# Patient Record
Sex: Female | Born: 1980 | Hispanic: Yes | Marital: Single | Smoking: Never smoker
Health system: Southern US, Community
[De-identification: ages and names within clinical notes are randomized; demographics above are authoritative.]

## PROBLEM LIST (undated history)

## (undated) HISTORY — PX: BREAST BIOPSY: SHX20

---

## 2000-08-24 DIAGNOSIS — N6320 Unspecified lump in the left breast, unspecified quadrant: Secondary | ICD-10-CM | POA: Insufficient documentation

## 2005-01-05 ENCOUNTER — Emergency Department: Payer: Self-pay | Admitting: Emergency Medicine

## 2005-01-06 ENCOUNTER — Ambulatory Visit: Payer: Self-pay | Admitting: Emergency Medicine

## 2005-08-15 ENCOUNTER — Inpatient Hospital Stay: Payer: Self-pay | Admitting: General Surgery

## 2007-01-29 ENCOUNTER — Emergency Department: Payer: Self-pay | Admitting: Unknown Physician Specialty

## 2011-08-17 ENCOUNTER — Inpatient Hospital Stay: Payer: Self-pay | Admitting: Obstetrics and Gynecology

## 2011-08-17 LAB — CBC WITH DIFFERENTIAL/PLATELET
Basophil #: 0 10*3/uL (ref 0.0–0.1)
Eosinophil #: 0 10*3/uL (ref 0.0–0.7)
HCT: 37.1 % (ref 35.0–47.0)
HGB: 12.3 g/dL (ref 12.0–16.0)
Lymphocyte %: 23.2 %
MCH: 29.2 pg (ref 26.0–34.0)
MCHC: 33.2 g/dL (ref 32.0–36.0)
Monocyte #: 0.6 10*3/uL (ref 0.0–0.7)
Neutrophil #: 5.6 10*3/uL (ref 1.4–6.5)
Neutrophil %: 68.8 %
RDW: 17.3 % — ABNORMAL HIGH (ref 11.5–14.5)
WBC: 8.2 10*3/uL (ref 3.6–11.0)

## 2011-08-17 LAB — CK TOTAL AND CKMB (NOT AT ARMC)
CK, Total: 75 U/L (ref 21–215)
CK, Total: 63 U/L (ref 21–215)
CK-MB: 2.4 ng/mL (ref 0.5–3.6)

## 2011-08-17 LAB — TROPONIN I: Troponin-I: <0.02 ng/mL

## 2011-08-18 LAB — HEMATOCRIT: HCT: 29.1 % — ABNORMAL LOW (ref 35.0–47.0)

## 2013-07-24 ENCOUNTER — Encounter: Payer: Self-pay | Admitting: General Surgery

## 2013-07-24 ENCOUNTER — Ambulatory Visit: Payer: Self-pay

## 2013-08-08 ENCOUNTER — Ambulatory Visit: Payer: Self-pay | Admitting: General Surgery

## 2013-08-21 ENCOUNTER — Ambulatory Visit (INDEPENDENT_AMBULATORY_CARE_PROVIDER_SITE_OTHER): Payer: PRIVATE HEALTH INSURANCE | Admitting: General Surgery

## 2013-08-21 ENCOUNTER — Encounter: Payer: Self-pay | Admitting: General Surgery

## 2013-08-21 VITALS — BP 116/70 | HR 76 | Resp 12 | Ht 62.0 in | Wt 172.0 lb

## 2013-08-21 DIAGNOSIS — R229 Localized swelling, mass and lump, unspecified: Secondary | ICD-10-CM

## 2013-08-21 DIAGNOSIS — D229 Melanocytic nevi, unspecified: Secondary | ICD-10-CM

## 2013-08-21 DIAGNOSIS — N6320 Unspecified lump in the left breast, unspecified quadrant: Secondary | ICD-10-CM

## 2013-08-21 NOTE — Progress Notes (Signed)
[name], female   DOB: 09/29/1980, 33 y.o.   MRN: [id number]  Chief Complaint  Patient presents with  . Breast Problem    HPI Briana James is a 33 y.o. female who presents for an evaluation of a left breast nodule. No mammogram was done at this time. The patient noticed this area in 2002 when she was pregnant she had it looked at and was told not to worry about it that it was milk build up. She has since then had another child and the area is still there. She states she has left breast pain all over that comes randomly and goes away within a few seconds. She does not associate it with her monthly cycles. She described that the breast nodule is the size of the point of a pen or pencil. She denies it getting any larger. No injuries to the breasts. She nursed with her third child for a short period of time (approximately 3 weeks). She did not nurse her first 2 children. There is no history of breast trauma.   HPI  History reviewed. No pertinent past medical history.  Past Surgical History  Procedure Laterality Date  . Breast biopsy Right 2003-2004    History reviewed. No pertinent family history.  Social History History  Substance Use Topics  . Smoking status: Never Smoker   . Smokeless tobacco: Never Used  . Alcohol Use: No    No Known Allergies  No current outpatient prescriptions on file.   No current facility-administered medications for this visit.    Review of Systems Review of Systems  Blood pressure 116/70, pulse 76, resp. rate 12, height 5\' 2"  (1.575 m), weight 172 lb (78.019 kg), last menstrual period 08/20/2013.  Physical Exam Physical Exam  Constitutional: She is oriented to person, place, and time. She appears well-developed and well-nourished.  Neck: Neck supple. No thyromegaly present.  Cardiovascular: Normal rate, regular rhythm and normal heart sounds.   No murmur heard. Pulmonary/Chest: Effort normal and breath sounds normal. Right breast  exhibits no inverted nipple, no mass, no nipple discharge, no skin change and no tenderness. Left breast exhibits no inverted nipple, no mass, no nipple discharge, no skin change and no tenderness.    Well healed circular areolar 2:30-3 o'clock of right breast.   Skin tag on inframammary gland at 5 o'clock of left breast.   At 4 o'clock 2 mm thickening just inside the areola of left breast.   Lymphadenopathy:    She has no cervical adenopathy.    She has no axillary adenopathy.  Neurological: She is alert and oriented to person, place, and time.  Skin: Skin is warm and dry.       Data Reviewed [facility] notes  Assessment    Dermal nodule.  Past [place], unclear if it's related to the breast with the associated musculoskeletal tissue. Pigmented mole of the right posterior shoulder, unlikely but possible melanoma.    Plan    The breast nodule by patient report is unchanged over 12 years. I think this is a dermal nodule related to the accessory glands of [place] and no intervention is required.  I did encourage the patient to have a biopsy completed of the right posterior shoulder mole. She was amenable to this. 3 cc of 0.5% Xylocaine with 0.25% Marcaine with one 200,000 of epinephrine was utilized well-tolerated. Chlor prep was applied to the skin. A 2.5 mm punch biopsy was completed. This was sent in formalin for routine histology. The  area was covered with Telfa and Tegaderm dressing. She return in one week for suture removal.   The patient may benefit from a trial of anti-inflammatories of the breast, but observation alone is also appropriate.       Robert Bellow 08/24/2013, 2:43 PM

## 2013-08-21 NOTE — Patient Instructions (Signed)
Patient to return for suture removal.

## 2013-08-24 DIAGNOSIS — R229 Localized swelling, mass and lump, unspecified: Secondary | ICD-10-CM | POA: Insufficient documentation

## 2013-08-28 ENCOUNTER — Ambulatory Visit (INDEPENDENT_AMBULATORY_CARE_PROVIDER_SITE_OTHER): Payer: Self-pay | Admitting: *Deleted

## 2013-08-28 DIAGNOSIS — D239 Other benign neoplasm of skin, unspecified: Secondary | ICD-10-CM

## 2013-08-28 DIAGNOSIS — D229 Melanocytic nevi, unspecified: Secondary | ICD-10-CM

## 2013-08-28 LAB — PATHOLOGY

## 2013-08-28 NOTE — Patient Instructions (Signed)
Patient to return as needed. Patient will be notified of pathology results once we receive them. The patient is aware to call back for any questions or concerns.

## 2013-08-28 NOTE — Progress Notes (Signed)
Patient came in today for a wound check.  The wound is clean, with no signs of infection noted. The sutures were removed and steri strips applied. Follow up as scheduled. Patient will be notified of pathology results once we receive results.

## 2013-09-03 ENCOUNTER — Telehealth: Payer: Self-pay

## 2013-09-03 NOTE — Telephone Encounter (Signed)
Notified patient as instructed, patient pleased. Discussed having Dr Bary Castilla remove the rest of the area at no charge, patient agrees. Patient scheduled for appointment for removal of area on 09/18/13 at 4:00 pm.

## 2013-09-18 ENCOUNTER — Encounter: Payer: Self-pay | Admitting: General Surgery

## 2013-09-18 ENCOUNTER — Ambulatory Visit (INDEPENDENT_AMBULATORY_CARE_PROVIDER_SITE_OTHER): Payer: Self-pay | Admitting: General Surgery

## 2013-09-18 VITALS — BP 108/64 | HR 62 | Resp 12 | Ht 62.0 in | Wt 176.0 lb

## 2013-09-18 DIAGNOSIS — R229 Localized swelling, mass and lump, unspecified: Secondary | ICD-10-CM

## 2013-09-18 NOTE — Patient Instructions (Addendum)
Keep area clean Dressing care reviewed. return for suture removal in one week

## 2013-09-18 NOTE — Progress Notes (Signed)
Patient ID: Briana James, female   DOB: 11/29/1980, 33 y.o.   MRN: [id number]  Chief Complaint  Patient presents with  . Procedure    excision pigmented rash area    HPI Briana James is a 33 y.o. female.  here today for excision pigmented rash area posterior neck.   HPI  No past medical history on file.  Past Surgical History  Procedure Laterality Date  . Breast biopsy Right 2003-2004    No family history on file.  Social History History  Substance Use Topics  . Smoking status: Never Smoker   . Smokeless tobacco: Never Used  . Alcohol Use: No    No Known Allergies  No current outpatient prescriptions on file.   No current facility-administered medications for this visit.    Review of Systems Review of Systems  Constitutional: Negative.   Respiratory: Negative.   Cardiovascular: Negative.     Blood pressure 108/64, pulse 62, resp. rate 12, height 5\' 2"  (1.575 m), weight 176 lb (79.833 kg), last menstrual period 08/18/2013.  Physical Exam Physical Exam The biopsy site on the right posterior neck is healing without evidence of infection. The patient had a question about a small seborrheic keratosis on the right lateral thigh and a small flap 3 mm uniformly pigmented lesion on the right anterior thigh. Uterus suspicious for malignancy.  Data Reviewed Diagnosis: RIGHT NECK MOLE ON POSTERIOR PUNCH BIOPSY: - INTRADERMAL MELANOCYTIC NEVUS, SEE COMMENT. Comment: Correlation with the clinical appearance is needed since the nevus involves the peripheral edges of the biopsy. It is generally recommended that pigmented lesions be entirely removed, so if this is a small sample from a larger lesion, then excision may be necessary. Intradepartmental consultation was obtained. XDB/08/28/2013    Assessment    Melanocytic nevus right posterior neck.     Plan    Excision was reviewed with the patient. 10 cc of 0.5% Xylocaine with 0.25% Marcaine with 1-200,000 units of  epinephrine was utilized and well tolerated. ChloraPrep was applied to the skin. The residual nevus was excised through an elliptical incision. The wound was closed with interrupted 4-0 Prolene sutures x3. Telfa and Tegaderm dressing applied. The patient will return in one week for suture removal.      PCP: none [facility]  [facility] [place] 09/18/2013, 8:13 PM

## 2013-09-21 LAB — PATHOLOGY

## 2013-09-24 ENCOUNTER — Telehealth: Payer: Self-pay | Admitting: *Deleted

## 2013-09-24 NOTE — Telephone Encounter (Signed)
Notified patient as instructed, patient pleased. Discussed follow-up appointments, patient agrees  

## 2013-09-24 NOTE — Telephone Encounter (Signed)
Message copied by Carson Myrtle on Tue Sep 24, 2013  8:29 AM ------      Message from: [place], [place] W      Created: Mon Sep 23, 2013 10:23 AM       Notify pathology is as expected and clear. Suture removal as planned.      ----- Message -----         From: [facility] Lab Results In Interface         Sent: 09/21/2013   5:43 AM           To: Robert Bellow, MD                   ------

## 2013-09-25 ENCOUNTER — Ambulatory Visit (INDEPENDENT_AMBULATORY_CARE_PROVIDER_SITE_OTHER): Payer: Self-pay | Admitting: *Deleted

## 2013-09-25 DIAGNOSIS — R229 Localized swelling, mass and lump, unspecified: Secondary | ICD-10-CM

## 2013-09-25 NOTE — Progress Notes (Signed)
The sutures were removed and steri strips applied. Patient aware of pathology results. Patient to follow up as needed.

## 2013-09-25 NOTE — Patient Instructions (Signed)
The patient is aware to call back for any questions or concerns. Patient to return as needed. 

## 2014-04-14 ENCOUNTER — Encounter: Payer: Self-pay | Admitting: General Surgery

## 2014-09-29 ENCOUNTER — Emergency Department: Admit: 2014-09-29 | Disposition: A | Discharge status: Home / Self Care | Payer: Self-pay | Admitting: Student

## 2014-10-21 NOTE — H&P (Signed)
L&D Evaluation:  History:   HPI 34 yo G3P2002 at [redacted]w[redacted]d by D=22w [place] derived EDC of 08/22/11 presenting with contractions.    PNC at [facility] HD noteable for late entry to care.  PNL O pos / ABSC neg / RI / VZI / RPR NR / HBsAg neg / HIV neg / Glucola 106 / GBS neg.  Received Tdap 07/27/11.    Presents with contractions    Patient's Medical History anemia    Patient's Surgical History cholecystectomy 2008    Medications Pre Natal Vitamins    Allergies NKDA    Social History none    Family History Non-Contributory   Exam:   Vital Signs stable    General no apparent distress    Abdomen gravid, tender with contractions    Estimated Fetal Weight Average for gestational age    Fetal Position vtx    Mebranes Intact    FHT 120, moderate, + accels, occasional decels some of which appear to be late decelerations    Ucx regular, every 6-11min   Impression:   Impression early labor   Plan:   Plan EFM/NST, monitor contractions and for cervical change    Comments Given question of possible later decelerations will admit patient, supportive measures such as IV hydration, position changes, and supplemental O2 if not improved will consider proceeding with primary LTCS for delivery   Electronic Signatures: Dorthula Nettles (MD)  (Signed 06-Mar-13 03:42)  Authored: L&D Evaluation   Last Updated: 06-Mar-13 03:42 by Dorthula Nettles (MD)

## 2014-10-27 ENCOUNTER — Encounter: Payer: Self-pay | Admitting: General Practice

## 2014-10-27 ENCOUNTER — Emergency Department
Admission: EM | Admit: 2014-10-27 | Discharge: 2014-10-28 | Disposition: A | Discharge status: Home / Self Care | Payer: Self-pay | Attending: Emergency Medicine | Admitting: Emergency Medicine

## 2014-10-27 DIAGNOSIS — M25539 Pain in unspecified wrist: Secondary | ICD-10-CM

## 2014-10-27 DIAGNOSIS — F4321 Adjustment disorder with depressed mood: Secondary | ICD-10-CM

## 2014-10-27 DIAGNOSIS — F32A Depression, unspecified: Secondary | ICD-10-CM

## 2014-10-27 DIAGNOSIS — F329 Major depressive disorder, single episode, unspecified: Secondary | ICD-10-CM | POA: Insufficient documentation

## 2014-10-27 DIAGNOSIS — Z3202 Encounter for pregnancy test, result negative: Secondary | ICD-10-CM | POA: Insufficient documentation

## 2014-10-27 LAB — URINALYSIS COMPLETE WITH MICROSCOPIC (ARMC ONLY)
BILIRUBIN URINE: NEGATIVE
Glucose, UA: NEGATIVE mg/dL
HGB URINE DIPSTICK: NEGATIVE
Ketones, ur: NEGATIVE mg/dL
Nitrite: NEGATIVE
PH: 5 (ref 5.0–8.0)
Protein, ur: NEGATIVE mg/dL
SPECIFIC GRAVITY, URINE: 1.027 (ref 1.005–1.030)

## 2014-10-27 LAB — COMPREHENSIVE METABOLIC PANEL
ALBUMIN: 4.3 g/dL (ref 3.5–5.0)
ALK PHOS: 87 U/L (ref 38–126)
ALT: 46 U/L (ref 14–54)
AST: 40 U/L (ref 15–41)
Anion gap: 9 (ref 5–15)
BILIRUBIN TOTAL: 1.6 mg/dL — AB (ref 0.3–1.2)
BUN: 12 mg/dL (ref 6–20)
CO2: 25 mmol/L (ref 22–32)
CREATININE: 0.55 mg/dL (ref 0.44–1.00)
Calcium: 9.1 mg/dL (ref 8.9–10.3)
Chloride: 105 mmol/L (ref 101–111)
GFR calc Af Amer: >60 mL/min (ref >60)
Glucose, Bld: 98 mg/dL (ref 65–99)
POTASSIUM: 3.6 mmol/L (ref 3.5–5.1)
SODIUM: 139 mmol/L (ref 135–145)
Total Protein: 8 g/dL (ref 6.5–8.1)

## 2014-10-27 LAB — URINE DRUG SCREEN, QUALITATIVE (ARMC ONLY)
AMPHETAMINES, UR SCREEN: NOT DETECTED
BENZODIAZEPINE, UR SCRN: NOT DETECTED
Barbiturates, Ur Screen: NOT DETECTED
Cannabinoid 50 Ng, Ur ~~LOC~~: NOT DETECTED
Cocaine Metabolite,Ur ~~LOC~~: NOT DETECTED
MDMA (Ecstasy)Ur Screen: NOT DETECTED
Methadone Scn, Ur: NOT DETECTED
OPIATE, UR SCREEN: NOT DETECTED
Phencyclidine (PCP) Ur S: NOT DETECTED
TRICYCLIC, UR SCREEN: NOT DETECTED

## 2014-10-27 LAB — CBC
HCT: 39.5 % (ref 35.0–47.0)
Hemoglobin: 13.8 g/dL (ref 12.0–16.0)
MCH: 30.4 pg (ref 26.0–34.0)
MCHC: 34.8 g/dL (ref 32.0–36.0)
MCV: 87.3 fL (ref 80.0–100.0)
Platelets: 222 10*3/uL (ref 150–440)
RBC: 4.53 MIL/uL (ref 3.80–5.20)
RDW: 12.9 % (ref 11.5–14.5)
WBC: 8.9 10*3/uL (ref 3.6–11.0)

## 2014-10-27 LAB — ETHANOL

## 2014-10-27 LAB — SALICYLATE LEVEL: Salicylate Lvl: <4 mg/dL (ref 2.8–30.0)

## 2014-10-27 LAB — ACETAMINOPHEN LEVEL: Acetaminophen (Tylenol), Serum: <10 ug/mL — ABNORMAL LOW (ref 10–30)

## 2014-10-27 LAB — POCT PREGNANCY, URINE: PREG TEST UR: NEGATIVE

## 2014-10-27 MED ORDER — IBUPROFEN 600 MG PO TABS
ORAL_TABLET | ORAL | Status: AC
  Administered 2014-10-27: 600 mg via ORAL
  Filled 2014-10-27: qty 1

## 2014-10-27 MED ORDER — IBUPROFEN 600 MG PO TABS
600.0000 mg | ORAL_TABLET | Freq: Once | ORAL | Status: AC
  Administered 2014-10-27: 600 mg via ORAL

## 2014-10-27 NOTE — ED Notes (Signed)
BEHAVIORAL HEALTH ROUNDING Patient sleeping: No. Patient alert and oriented: yes Behavior appropriate: Yes.  ; If no, describe:   Nutrition and fluids offered: Yes  Toileting and hygiene offered: Yes  Sitter present: no Law enforcement present: Yes  and ODS  

## 2014-10-27 NOTE — ED Notes (Addendum)
BEHAVIORAL HEALTH ROUNDING Patient sleeping: No. Patient alert and oriented: yes Behavior appropriate: Yes.  ; If no, describe:  Nutrition and fluids offered: Yes  Toileting and hygiene offered: Yes  Sitter present:no Law enforcement present: yes

## 2014-10-27 NOTE — ED Provider Notes (Signed)
[facility] Emergency Department Provider Note    ____________________________________________  Time seen: 1905  I have reviewed the triage vital signs and the nursing notes.   HISTORY  Chief Complaint Suicidal   History limited by: Not Limited   HPI Briana James is a 34 y.o. female who presents to the emergency department under IVC for SI.Asian states that she has been feeling suicidal for the past 2 weeks. There was a stressor that triggered these feelings although she declined to discuss what that was with me. She states that she has never felt this bad before. She has a plan of overdosing on medication and her temp for SI. She denies any recent medical complaints including fever, abdominal pain, nausea, vomiting, chest pain.     History reviewed. No pertinent past medical history.  Patient Active Problem List   Diagnosis Date Noted  . Adjustment disorder with depressed mood 10/28/2014  . Depression 10/28/2014  . Wrist pain 10/28/2014  . Skin nodule 08/24/2013  . Breast mass, left 08/24/2000    Past Surgical History  Procedure Laterality Date  . Breast biopsy Right 2003-2004    No current outpatient prescriptions on file.  Allergies Review of patient's allergies indicates no known allergies.  No family history on file.  Social History History  Substance Use Topics  . Smoking status: Never Smoker   . Smokeless tobacco: Never Used  . Alcohol Use: No    Review of Systems  Constitutional: Negative for fever. Cardiovascular: Negative for chest pain. Respiratory: Negative for shortness of breath. Gastrointestinal: Negative for abdominal pain, vomiting and diarrhea. Genitourinary: Negative for dysuria. Musculoskeletal: Negative for back pain. Skin: Negative for rash. Neurological: Negative for headaches, focal weakness or numbness. Psychiatric:Depression, SI   10-point ROS otherwise  negative.  ____________________________________________   PHYSICAL EXAM:  VITAL SIGNS: ED Triage Vitals  Enc Vitals Group     BP 10/27/14 1717 119/87 mmHg     Pulse Rate 10/27/14 1717 71     Resp 10/27/14 1717 18     Temp 10/27/14 1717 98.3 F (36.8 C)     Temp Source 10/27/14 1717 Oral     SpO2 10/27/14 1717 100 %     Weight 10/27/14 1717 170 lb (77.111 kg)     Height 10/27/14 1717 5\' 5"  (1.651 m)   Constitutional: Alert and oriented. Well appearing and in no distress. Eyes: Conjunctivae are normal. PERRL. Normal extraocular movements. ENT   Head: Normocephalic and atraumatic.   Nose: No congestion/rhinnorhea.   Mouth/Throat: Mucous membranes are moist.   Neck: No stridor. Hematological/Lymphatic/Immunilogical: No cervical lymphadenopathy. Cardiovascular: Normal rate, regular rhythm.  No murmurs, rubs, or gallops. Respiratory: Normal respiratory effort without tachypnea nor retractions. Breath sounds are clear and equal bilaterally. No wheezes/rales/rhonchi. Gastrointestinal: Soft and nontender. No distention.  Genitourinary: Deferred Musculoskeletal: Normal range of motion in all extremities. No joint effusions.  No lower extremity tenderness nor edema. Neurologic:  Normal speech and language. No gross focal neurologic deficits are appreciated. Speech is normal.  Skin:  Skin is warm, dry and intact. No rash noted. Psychiatric: Depression, SI  ____________________________________________    LABS (pertinent positives/negatives)  Labs Reviewed  ACETAMINOPHEN LEVEL - Abnormal; Notable for the following:    Acetaminophen (Tylenol), Serum <10 (*)    All other components within normal limits  COMPREHENSIVE METABOLIC PANEL - Abnormal; Notable for the following:    Total Bilirubin 1.6 (*)    All other components within normal limits  URINALYSIS COMPLETEWITH MICROSCOPIC [facility])  -  Abnormal; Notable for the following:    Color, Urine YELLOW (*)    APPearance  CLEAR (*)    Leukocytes, UA TRACE (*)    Bacteria, UA RARE (*)    Squamous Epithelial / LPF 0-5 (*)    All other components within normal limits  CBC  ETHANOL  SALICYLATE LEVEL  URINE DRUG SCREEN, QUALITATIVE ([facility])  POC URINE PREG, ED  POCT PREGNANCY, URINE     ____________________________________________   EKG  None  ____________________________________________    RADIOLOGY  None  ____________________________________________   PROCEDURES  Procedure(s) performed: None  Critical Care performed: No  ____________________________________________   INITIAL IMPRESSION / ASSESSMENT AND PLAN / ED COURSE  Pertinent labs & imaging results that were available during my care of the patient were reviewed by me and considered in my medical decision making (see chart for details).  She presents to the emergency department under IVC for depression and SI. On my exam patient does appear depressed and does have suicidal ideation. Patient does not have any medical complaints at this time.  ____________________________________________   FINAL CLINICAL IMPRESSION(S) / ED DIAGNOSES  Final diagnoses:  Depression     Nance Pear, MD 10/28/14 1941

## 2014-10-27 NOTE — BH Assessment (Signed)
Assessment Note  Briana James is an 34 y.o. female. Briana James reports that about 2 weeks ago, she thought that she wanted to take her life. I went to the therapist today and they asked if I would do it again and I said If I had the money.  Patient states No matter what I do or how hard I try, nothing good happens. My family is breaking apart, Im not working, Im in pain, Im just crying all the time.  Briana James reports depressive symptoms. She denies having symptoms of anxiety. She denied auditory or visual hallucinations. She denied homicidal ideation or intent.  She states that if she had the money to buy sleeping pills, she would take them to commit suicide. She sustained injuries at work and reports being in pain from those injuries.  Axis I: Major Depression, Recurrent severe Axis II: Deferred Axis III: History reviewed. No pertinent past medical history. Axis IV: economic problems, other psychosocial or environmental problems and problems with primary support group Axis V: 41-50 serious symptoms  Past Medical History: History reviewed. No pertinent past medical history.  Past Surgical History  Procedure Laterality Date   Breast biopsy Right 2003-2004    Family History: No family history on file.  Social History:  reports that she has never smoked. She has never used smokeless tobacco. She reports that she does not drink alcohol or use illicit drugs.  Additional Social History:  Alcohol / Drug Use History of alcohol / drug use?: No history of alcohol / drug abuse  CIWA: CIWA-Ar BP: 103/68 mmHg Pulse Rate: 65 COWS:    Allergies: No Known Allergies  Home Medications:  (Not in a hospital admission)  OB/GYN Status:  Patient's last menstrual period was 10/20/2014 (exact date).  General Assessment Data Location of Assessment: [facility] ED TTS Assessment: In system Is this a Tele or Face-to-Face Assessment?: Face-to-Face Is this an Initial Assessment or a Re-assessment for this  encounter?: Initial Assessment Marital status: Married Is patient pregnant?: No Pregnancy Status: No Living Arrangements: Children Can pt return to current living arrangement?: Yes Admission Status: Involuntary Referral Source: MD     Crisis Care Plan Living Arrangements: Children Name of Psychiatrist: [facility]  Education Status Is patient currently in school?: No  Risk to self with the past 6 months Suicidal Ideation: No-Not Currently/Within Last 6 Months Has patient been a risk to self within the past 6 months prior to admission? : Yes Suicidal Intent: No-Not Currently/Within Last 6 Months Has patient had any suicidal intent within the past 6 months prior to admission? : Yes Is patient at risk for suicide?: Yes Suicidal Plan?: Yes-Currently Present Has patient had any suicidal plan within the past 6 months prior to admission? : Yes Specify Current Suicidal Plan: To overdose on sleeping pills Access to Means: Yes (Financially does not have the money, she reports) Specify Access to Suicidal Means: She can get pills What has been your use of drugs/alcohol within the last 12 months?:  (None) Previous Attempts/Gestures: No Intentional Self Injurious Behavior: None Family Suicide History: Unknown Recent stressful life event(s):  (injured at work, not working at this time, family separation)  Risk to Others within the past 6 months Homicidal Ideation: No Thoughts of Harm to Others: No Current Homicidal Plan: No Criminal Charges Pending?: No Does patient have a court date: No Is patient on probation?: No  Psychosis Hallucinations: None noted Delusions: None noted  Mental Status Report Appearance/Hygiene: In scrubs Eye Contact: Fair Motor Activity: Unremarkable Speech:  Soft Level of Consciousness: Alert Mood: Depressed Affect: Flat Anxiety Level: None Thought Processes: Coherent Judgement: Unable to Assess Orientation: Person, Place, Time, Situation Obsessive Compulsive  Thoughts/Behaviors: None        Prior Inpatient Therapy Prior Inpatient Therapy: No             Abuse/Neglect Assessment (Assessment to be complete while patient is alone) Physical Abuse: Denies Verbal Abuse: Denies Sexual Abuse: Denies Exploitation of patient/patient's resources: Denies Self-Neglect: Denies                Disposition:  Disposition Initial Assessment Completed for this Encounter: Yes Disposition of Patient: Referred to (To be seen by the psychiatrist)  On Site Evaluation by:   Reviewed with Physician:    Guerry Minors 10/27/2014 10:40 PM

## 2014-10-27 NOTE — ED Notes (Signed)
Pt resting in bed with eyes closed. No unusual behavior observed. Pt has no needs or concerns at this time. Will continue to monitor and f/u as needed.  

## 2014-10-27 NOTE — ED Notes (Addendum)
Pt reporting pain to right wrist. Requests medication. MD made aware. VORB 600mg . Order to be carried out by this RN.

## 2014-10-27 NOTE — ED Notes (Signed)
ED BHU [place] Is the patient under IVC or is there intent for IVC: Yes.   Is the patient medically cleared: Yes.   Is there vacancy in the ED BHU: Yes.   Is the population mix appropriate for patient: Yes.   Is the patient awaiting placement in inpatient or outpatient setting: unsure at this time Has the patient had a psychiatric consult: no Survey of unit performed for contraband, proper placement and condition of furniture, tampering with fixtures in bathroom, shower, and each patient room: Yes.  ; Findings:  APPEARANCE/BEHAVIOR cooperative NEURO ASSESSMENT Orientation: AAO x3  Hallucinations: No.None noted (Hallucinations) Speech: Normal Gait: normal RESPIRATORY ASSESSMENT Even and unlabored  CARDIOVASCULAR ASSESSMENT Heart rate wnl, skin warm and dry, skin color wnl GASTROINTESTINAL ASSESSMENT wnl EXTREMITIES Moves all extremities  PLAN OF CARE Provide calm/safe environment. Vital signs assessed twice daily. ED BHU Assessment once each 12-hour shift. Collaborate with intake RN daily or as condition indicates. Assure the ED provider has rounded once each shift. Provide and encourage hygiene. Provide redirection as needed. Assess for escalating behavior; address immediately and inform ED provider.  Assess family dynamic and appropriateness for visitation as needed: Yes.  ; If necessary, describe findings:  Educate the patient/family about BHU procedures/visitation: Yes.  ; If necessary, describe findings:

## 2014-10-27 NOTE — ED Notes (Signed)

## 2014-10-27 NOTE — ED Notes (Signed)
Patient arrives to recliner in hallway, states has had suicidal ideation x 2 weeks, states has not done anything at this point to harm herself. States she had a plan of buying pills and overdosing.

## 2014-10-27 NOTE — ED Notes (Signed)
Pt provided with sandwich tray and drink.  

## 2014-10-27 NOTE — ED Notes (Signed)
Pt report received from Matilde Bash. Pt transferred to ED BHU accompanied by this RN and [employer] officer without incident. Pt care assumed. Pt oriented to unit and advised cameras are present in every room of unit for pt safety. Pt verbalizes understanding. Pt has no needs or concerns at this time.

## 2014-10-27 NOTE — ED Notes (Signed)
Report given to [place], Therapist, sports. Pt transferred to ED BHU, accompanied by RN and [employer] officer.

## 2014-10-27 NOTE — ED Notes (Signed)
Pt. Arrived to ed from [place], escorted by [employer].  [facility] contacted [employer] to bring pt to ED due to [place]. PT reports experiencing Depression and thoughts of harming herself over the last two weeks. Pt. States "if i could afford it i would go by all these sleeping pills and take them".  PT alert and oriented. Tearful in triage.  Denies HI.

## 2014-10-27 NOTE — ED Notes (Addendum)

## 2014-10-27 NOTE — ED Notes (Signed)
BEHAVIORAL HEALTH ROUNDING Patient sleeping: No. Patient alert and oriented: yes Behavior appropriate: Yes.  ; If no, describe:  Nutrition and fluids offered: Yes  Toileting and hygiene offered: Yes  Sitter present: no Law enforcement present: Yes, ODS 

## 2014-10-28 DIAGNOSIS — F4321 Adjustment disorder with depressed mood: Secondary | ICD-10-CM

## 2014-10-28 DIAGNOSIS — F329 Major depressive disorder, single episode, unspecified: Secondary | ICD-10-CM

## 2014-10-28 DIAGNOSIS — F32A Depression, unspecified: Secondary | ICD-10-CM

## 2014-10-28 DIAGNOSIS — M25539 Pain in unspecified wrist: Secondary | ICD-10-CM

## 2014-10-28 NOTE — ED Notes (Signed)
Pt resting in bed with eyes closed. No unusual behavior observed. Pt has no needs or concerns at this time. Will continue to monitor and f/u as needed.  

## 2014-10-28 NOTE — ED Notes (Signed)
Pt informed to return if any life threatening symptoms occur.  

## 2014-10-28 NOTE — ED Notes (Signed)
BEHAVIORAL HEALTH ROUNDING Patient sleeping: Yes.   Patient alert and oriented: asleep Behavior appropriate: Yes.  ; If no, describe:  Nutrition and fluids offered: asleep Toileting and hygiene offered: asleep Sitter present: no Law enforcement present: Yes, [employer] 

## 2014-10-28 NOTE — ED Notes (Signed)
Pt. Given lunch tray. In room at this time. No acute distress noted.

## 2014-10-28 NOTE — ED Notes (Signed)
Pt. Sitting in day room watching TV at this time. Calm and Cooperative.

## 2014-10-28 NOTE — ED Provider Notes (Signed)
-----------------------------------------   6:59 AM on 10/28/2014 -----------------------------------------   BP 103/68 mmHg  Pulse 65  Temp(Src) 98.3 F (36.8 C) (Oral)  Resp 18  Ht 5\' 5"  (1.651 m)  Wt 170 lb (77.111 kg)  BMI 28.29 kg/m2  SpO2 100%  LMP 10/20/2014 (Exact Date)  The patient had no acute events since last update.  Calm and cooperative at this time.  Disposition is pending per Psychiatry/Behavioral Medicine team recommendations.     Gregor Hams, MD 10/28/14 [phone]

## 2014-10-28 NOTE — ED Notes (Signed)
Pts' sister contacted per pt request in order to get a ride to pt back home.

## 2014-10-28 NOTE — ED Notes (Signed)

## 2014-10-28 NOTE — Consult Note (Signed)
[facility] Face-to-Face Psychiatry Consult   Reason for Consult:  Patient presented under involuntary commitment initiated by [facility] period consult for appropriate psychiatric management Referring Physician:  gottlieb Patient Identification: Briana James MRN:  [id number] Principal Diagnosis: Adjustment disorder with depressed mood Diagnosis:   Patient Active Problem List   Diagnosis Date Noted  . Adjustment disorder with depressed mood [F43.21] 10/28/2014  . Depression [F32.9] 10/28/2014  . Wrist pain [M25.539] 10/28/2014  . Skin nodule [R22.9] 08/24/2013  . Breast mass, left [N63] 08/24/2000    Total Time spent with patient: 1 hour  Subjective:   Briana James is a 34 y.o. female patient admitted with "I just needed someone to talk to". Patient went to [facility] yesterday and was petition because she reported suicidal ideation a couple weeks ago. Patient needs evaluation for mood symptoms.Marland Kitchen  HPI:  History from patient and the chart. Review of paperwork. Patient went to [facility] yesterday and talked to the doctor. She told them that she had had thoughts about killing herself 2 weeks ago but had not acted on it. She tells me today that she is no longer having any active thoughts about killing herself. Her mood had been feeling sad and down recently. Been going on for almost 2 weeks. She's been feeling like she is under a lot of stress. The most acute major stress is that her husband who is not a citizen was denied a waiver to come back into the [place]. Also the patient is not currently working because she was injured on the job. She has 3 children 2 young ones who live with her and one who is with his father in [place]. Patient reports that she had been having some trouble sleeping and eating. Denies having any psychotic symptoms. Denies that she's been drinking alcohol or abusing drugs. Denies that she's been on any prescription medicine. She tells me today that since coming here to the hospital she is realize that her  problems are not as bad as other people's and that has made her feel less depressed. She says she feels like her mood is much better now.  Past psychiatric history: Patient has seen counselors before specifically at [place] a day. She had been prescribed antidepressants but it never taken them in the past. No history of suicidality or violence no history of hospitalization. No history of psychotic symptoms.  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 claim. She lives with her 2 young children. Her mother is staying with her right now. There is a lot of other extended family in the area. The patient's husband is stuck in [place] and has been denied a waiver which was a major stress for the patient. The patient herself says that she is a citizen.  Medical history is in acute soft tissue injury to the right wrist and back no fracture. No other medical problems  Substance abuse history patient denies regular use of alcohol or drugs.  Current medication none HPI Elements:   Quality:  Depressed mood and anxiety. Severity:  Moderate. Timing:  Last 2 weeks since learning her husband could not come back to the [place]. Duration:  Up and down worse for the last few days. Context:  Stress from being without her husband as well as being out of work.  Past Medical History: History reviewed. No pertinent past medical history.  Past Surgical History  Procedure Laterality Date  . Breast biopsy Right 2003-2004   Family History: No family history on file. Social History:  History  Alcohol Use No     History  Drug Use No    History   Social History  . Marital Status: Single    Spouse Name: N/A  . Number of Children: N/A  . Years of Education: N/A   Social History Main Topics  . Smoking status: Never Smoker   . Smokeless tobacco: Never Used  . Alcohol Use: No  . Drug Use: No  . Sexual  Activity: Not on file   Other Topics Concern  . None   Social History Narrative   Additional Social History:    History of alcohol / drug use?: No history of alcohol / drug abuse                     Allergies:  No Known Allergies  Labs:  Results for orders placed or performed during the hospital encounter of 10/27/14 (from the past 48 hour(s))  Acetaminophen level     Status: Abnormal   Collection Time: 10/27/14  5:30 PM  Result Value Ref Range   Acetaminophen (Tylenol), Serum <10 (L) 10 - 30 ug/mL    Comment:        THERAPEUTIC CONCENTRATIONS VARY SIGNIFICANTLY. A RANGE OF 10-30 ug/mL MAY BE AN EFFECTIVE CONCENTRATION FOR MANY PATIENTS. HOWEVER, SOME ARE BEST TREATED AT CONCENTRATIONS OUTSIDE THIS RANGE. ACETAMINOPHEN CONCENTRATIONS >150 ug/mL AT 4 HOURS AFTER INGESTION AND >50 ug/mL AT 12 HOURS AFTER INGESTION ARE OFTEN ASSOCIATED WITH TOXIC REACTIONS.   CBC     Status: None   Collection Time: 10/27/14  5:30 PM  Result Value Ref Range   WBC 8.9 3.6 - 11.0 K/uL   RBC 4.53 3.80 - 5.20 MIL/uL   Hemoglobin 13.8 12.0 - 16.0 g/dL   HCT 39.5 35.0 - 47.0 %   MCV 87.3 80.0 - 100.0 fL   MCH 30.4 26.0 - 34.0 pg   MCHC 34.8 32.0 - 36.0 g/dL   RDW 12.9 11.5 - 14.5 %   Platelets 222 150 - 440 K/uL  Comprehensive metabolic panel     Status: Abnormal   Collection Time: 10/27/14  5:30 PM  Result Value Ref Range   Sodium 139 135 - 145 mmol/L   Potassium 3.6 3.5 - 5.1 mmol/L   Chloride 105 101 - 111 mmol/L   CO2 25 22 - 32 mmol/L   Glucose, Bld 98 65 - 99 mg/dL   BUN 12 6 - 20 mg/dL   Creatinine, Ser 0.55 0.44 - 1.00 mg/dL   Calcium 9.1 8.9 - 10.3 mg/dL   Total Protein 8.0 6.5 - 8.1 g/dL   Albumin 4.3 3.5 - 5.0 g/dL   AST 40 15 - 41 U/L   ALT 46 14 - 54 U/L   Alkaline Phosphatase 87 38 - 126 U/L   Total Bilirubin 1.6 (H) 0.3 - 1.2 mg/dL   GFR calc non Af Amer >60 >60 mL/min   GFR calc Af Amer >60 >60 mL/min    Comment: (NOTE) The eGFR has been calculated  using the CKD EPI equation. This calculation has not been validated in all clinical situations. eGFR's persistently <60 mL/min signify possible Chronic Kidney Disease.    Anion gap 9 5 - 15  Ethanol (ETOH)     Status: None   Collection Time: 10/27/14  5:30 PM  Result Value Ref Range  Alcohol, Ethyl (B) <5 <5 mg/dL    Comment:        LOWEST DETECTABLE LIMIT FOR SERUM ALCOHOL IS 11 mg/dL FOR MEDICAL PURPOSES ONLY   Salicylate level     Status: None   Collection Time: 10/27/14  5:30 PM  Result Value Ref Range   Salicylate Lvl <5.9 2.8 - 30.0 mg/dL  Urinalysis complete, with microscopic [facility])     Status: Abnormal   Collection Time: 10/27/14  5:30 PM  Result Value Ref Range   Color, Urine YELLOW (A) YELLOW   APPearance CLEAR (A) CLEAR   Glucose, UA NEGATIVE NEGATIVE mg/dL   Bilirubin Urine NEGATIVE NEGATIVE   Ketones, ur NEGATIVE NEGATIVE mg/dL   Specific Gravity, Urine 1.027 1.005 - 1.030   Hgb urine dipstick NEGATIVE NEGATIVE   pH 5.0 5.0 - 8.0   Protein, ur NEGATIVE NEGATIVE mg/dL   Nitrite NEGATIVE NEGATIVE   Leukocytes, UA TRACE (A) NEGATIVE   RBC / HPF 0-5 0 - 5 RBC/hpf   WBC, UA 6-30 0 - 5 WBC/hpf   Bacteria, UA RARE (A) NONE SEEN   Squamous Epithelial / LPF 0-5 (A) NONE SEEN   Mucous PRESENT   Urine Drug Screen, Qualitative [facility])     Status: None   Collection Time: 10/27/14  5:30 PM  Result Value Ref Range   Tricyclic, Ur Screen NONE DETECTED NONE DETECTED   Amphetamines, Ur Screen NONE DETECTED NONE DETECTED   MDMA (Ecstasy)Ur Screen NONE DETECTED NONE DETECTED   Cocaine Metabolite,Ur [place] NONE DETECTED NONE DETECTED   Opiate, Ur Screen NONE DETECTED NONE DETECTED   Phencyclidine (PCP) Ur S NONE DETECTED NONE DETECTED   Cannabinoid 50 Ng, Ur [place] NONE DETECTED NONE DETECTED   Barbiturates, Ur Screen NONE DETECTED NONE DETECTED   Benzodiazepine, Ur Scrn NONE DETECTED NONE DETECTED   Methadone Scn, Ur NONE DETECTED NONE DETECTED    Comment: (NOTE) 292  Tricyclics,  urine               Cutoff 1000 ng/mL 200  Amphetamines, urine             Cutoff 1000 ng/mL 300  MDMA (Ecstasy), urine           Cutoff 500 ng/mL 400  Cocaine Metabolite, urine       Cutoff 300 ng/mL 500  Opiate, urine                   Cutoff 300 ng/mL 600  Phencyclidine (PCP), urine      Cutoff 25 ng/mL 700  Cannabinoid, urine              Cutoff 50 ng/mL 800  Barbiturates, urine             Cutoff 200 ng/mL 900  Benzodiazepine, urine           Cutoff 200 ng/mL 1000 Methadone, urine                Cutoff 300 ng/mL 1100 1200 The urine drug screen provides only a preliminary, unconfirmed 1300 analytical test result and should not be used for non-medical 1400 purposes. Clinical consideration and professional judgment should 1500 be applied to any positive drug screen result due to possible 1600 interfering substances. A more specific alternate chemical method 1700 must be used in order to obtain a confirmed analytical result.  1800 Gas chromato graphy / mass spectrometry (GC/MS) is the preferred 1900 confirmatory method.   Pregnancy, urine POC  Status: None   Collection Time: 10/27/14  5:37 PM  Result Value Ref Range   Preg Test, Ur NEGATIVE NEGATIVE    Comment:        THE SENSITIVITY OF THIS METHODOLOGY IS >24 mIU/mL     Vitals: Blood pressure 105/80, pulse 67, temperature 97.5 F (36.4 C), temperature source Oral, resp. rate 17, height '5\' 5"'  (1.651 m), weight 77.111 kg (170 lb), last menstrual period 10/20/2014, SpO2 100 %.  Risk to Self: Suicidal Ideation: No-Not Currently/Within Last 6 Months Suicidal Intent: No-Not Currently/Within Last 6 Months Is patient at risk for suicide?: Yes Suicidal Plan?: Yes-Currently Present Specify Current Suicidal Plan: To overdose on sleeping pills Access to Means: Yes (Financially does not have the money, she reports) Specify Access to Suicidal Means: She can get pills What has been your use of drugs/alcohol within the last 12 months?:   (None) Intentional Self Injurious Behavior: None Risk to Others: Homicidal Ideation: No Thoughts of Harm to Others: No Current Homicidal Plan: No Criminal Charges Pending?: No Does patient have a court date: No Prior Inpatient Therapy: Prior Inpatient Therapy: No Prior Outpatient Therapy:    No current facility-administered medications for this encounter.   No current outpatient prescriptions on file.    Musculoskeletal: Strength & Muscle Tone: within normal limits Gait & Station: normal Patient leans: N/A  Psychiatric Specialty Exam: Physical Exam  Constitutional: She appears well-developed and well-nourished.  HENT:  Head: Normocephalic and atraumatic.  Eyes: Conjunctivae are normal. Pupils are equal, round, and reactive to light.  Neck: Normal range of motion.  Cardiovascular: Normal heart sounds.   Respiratory: Effort normal.  GI: Soft.  Musculoskeletal:       Right forearm: She exhibits tenderness.  Neurological: She is alert.  Skin: Skin is warm and dry.  Psychiatric: She has a normal mood and affect. Her speech is normal and behavior is normal. Judgment and thought content normal. Cognition and memory are normal.    Review of Systems  Constitutional: Negative.   HENT: Negative.   Eyes: Negative.   Respiratory: Negative.   Cardiovascular: Negative.   Gastrointestinal: Negative.   Musculoskeletal: Positive for joint pain.  Skin: Negative.   Neurological: Negative.   Psychiatric/Behavioral: Positive for depression. Negative for suicidal ideas, hallucinations, memory loss and substance abuse. The patient has insomnia. The patient is not nervous/anxious.     Blood pressure 105/80, pulse 67, temperature 97.5 F (36.4 C), temperature source Oral, resp. rate 17, height '5\' 5"'  (1.651 m), weight 77.111 kg (170 lb), last menstrual period 10/20/2014, SpO2 100 %.Body mass index is 28.29 kg/(m^2).  General Appearance: Fairly Groomed and Well Groomed  Engineer, water::  Good   Speech:  Normal Rate  Volume:  Normal  Mood:  Euthymic  Affect:  Full Range  Thought Process:  Linear and Logical  Orientation:  Full (Time, Place, and Person)  Thought Content:  Negative  Suicidal Thoughts:  No  Homicidal Thoughts:  No  Memory:  Immediate;   Good Recent;   Good Remote;   Good  Judgement:  Intact  Insight:  Fair  Psychomotor Activity:  Normal  Concentration:  Good  Recall:  Good  Fund of Knowledge:Good  Language: Good  Akathisia:  No  Handed:  Right  AIMS (if indicated):     Assets:  Communication Skills Desire for Improvement Financial Resources/Insurance Housing Intimacy Physical Health Social Support  ADL's:  Intact  Cognition: WNL  Sleep:      Medical Decision Making: Review of  Psycho-Social Stressors (1), New Problem, with no additional work-up planned (3), Review of Last Therapy Session (1), Independent Review of image, tracing or specimen (2) and Review of Medication Regimen & Side Effects (2)  Treatment Plan Summary: Plan Patient evaluated and chart reviewed. Patient today is convincingly denying suicidal ideation. Able to report multiple positive things in her life and plans for the future. Her affect is euthymic she is not confused there is no sign of psychosis. Patient no longer meets commitment criteria. Counseling and psychoeducation done. Would not sit start any medication yet but she should be referred back to [facility] and encouraged consider continuing to see a counselor and possibly a physician especially if symptoms continue.  Plan:  No evidence of imminent risk to self or others at present.   Patient does not meet criteria for psychiatric inpatient admission. Supportive therapy provided about ongoing stressors. Discussed crisis plan, support from social network, calling 911, coming to the Emergency Department, and calling Suicide Hotline. Disposition: Recommend discharge from the emergency room. Involuntary commitment  discontinued  CLAPACS, [employer] 10/28/2014 12:17 PM

## 2014-10-28 NOTE — ED Notes (Signed)
BEHAVIORAL HEALTH ROUNDING Patient sleeping: No. Patient alert and oriented: yes Behavior appropriate: Yes.   Nutrition and fluids offered: Yes  Toileting and hygiene offered: Yes  Sitter present: yes; 15 min checks  Law enforcement present: Yes

## 2014-10-28 NOTE — ED Notes (Signed)
ED BHU [place] Is the patient under IVC or is there intent for IVC: Yes.   Is the patient medically cleared: Yes.   Is there vacancy in the ED BHU: Yes.   Is the population mix appropriate for patient: Yes.   Is the patient awaiting placement in inpatient or outpatient setting: No. Has the patient had a psychiatric consult: No. Survey of unit performed for contraband, proper placement and condition of furniture, tampering with fixtures in bathroom, shower, and each patient room: Yes.   APPEARANCE/BEHAVIOR calm and cooperative NEURO ASSESSMENT Orientation: time, place and person Hallucinations: No.  Speech: Normal Gait: normal RESPIRATORY ASSESSMENT Breath sounds: Regular, Unlabored  CARDIOVASCULAR ASSESSMENT No acute distress noted. Pt. alert and oriented  GASTROINTESTINAL ASSESSMENT No distention. No acute distress noted  EXTREMITIES normal strength, tone, and muscle mass, no deformities PLAN OF CARE Provide calm/safe environment. Vital signs assessed twice daily. ED BHU Assessment once each 12-hour shift. Collaborate with intake RN daily or as condition indicates. Assure the ED provider has rounded once each shift. Provide and encourage hygiene. Provide redirection as needed. Assess for escalating behavior; address immediately and inform ED provider.  Assess family dynamic and appropriateness for visitation as needed: Yes.   Educate the patient/family about BHU procedures/visitation: Yes.

## 2014-10-28 NOTE — Discharge Instructions (Signed)
You were seen by Dr. Lissa Hoard packs the psychiatrist in the emergency department. He released U from IVC and felt that you were able to be treated on an outpatient basis. Follow-up at [facility] as directed. If you feel you are a danger to yourself or anyone else or if the condition worsens or does not improve or return to the emergency department.

## 2014-10-28 NOTE — ED Provider Notes (Signed)
-----------------------------------------   7:38 AM on 10/28/2014 -----------------------------------------   BP 103/68 mmHg  Pulse 65  Temp(Src) 98.3 F (36.8 C) (Oral)  Resp 18  Ht 5\' 5"  (1.651 m)  Wt 170 lb (77.111 kg)  BMI 28.29 kg/m2  SpO2 100%  LMP 10/20/2014 (Exact Date)  The patient had no acute events since last update.  Calm and cooperative at this time.  Disposition is pending per Psychiatry/Behavioral Medicine team recommendations.   Patient is under involuntary commitment and is cooperative this morning in the behavioral medicine unit.  Boris Lown, DO 10/28/14 [phone]

## 2014-10-28 NOTE — ED Provider Notes (Signed)
-----------------------------------------   2:13 PM on 10/28/2014 -----------------------------------------   BP 105/80 mmHg  Pulse 67  Temp(Src) 97.5 F (36.4 C) (Oral)  Resp 17  Ht 5\' 5"  (1.651 m)  Wt 170 lb (77.111 kg)  BMI 28.29 kg/m2  SpO2 100%  LMP 10/20/2014 (Exact Date)  The patient had no acute events since last update.  Calm and cooperative at this time.  Disposition is pending per Psychiatry/Behavioral Medicine team recommendations.   Patient was seen in the emergency department by Dr. Lissa Hoard packs. Dr. Lissa Hoard packs D rescinded the IVC. Plan is for patient to be dismissed from the emergency department. Patient will follow up with our [facility]. Patient is to continue medications as directed by Dr. Lissa Hoard packs.  Boris Lown, DO 10/28/14 1414

## 2014-10-28 NOTE — ED Notes (Signed)
IVC rescinded by [facility] @ 1130 with a follow -up for [facility]

## 2014-10-28 NOTE — ED Notes (Signed)
BEHAVIORAL HEALTH ROUNDING Patient sleeping: No. Patient alert and oriented: yes Behavior appropriate: Yes.   Nutrition and fluids offered: Yes  Toileting and hygiene offered: Yes  Sitter present: yes 15 min checks  Law enforcement present: Yes

## 2017-05-26 ENCOUNTER — Emergency Department
Admission: EM | Admit: 2017-05-26 | Discharge: 2017-05-26 | Disposition: A | Discharge status: Home / Self Care | Payer: Self-pay | Attending: Emergency Medicine | Admitting: Emergency Medicine

## 2017-05-26 ENCOUNTER — Other Ambulatory Visit: Payer: Self-pay

## 2017-05-26 ENCOUNTER — Encounter: Payer: Self-pay | Admitting: Emergency Medicine

## 2017-05-26 DIAGNOSIS — Z79899 Other long term (current) drug therapy: Secondary | ICD-10-CM | POA: Insufficient documentation

## 2017-05-26 DIAGNOSIS — J069 Acute upper respiratory infection, unspecified: Secondary | ICD-10-CM | POA: Insufficient documentation

## 2017-05-26 DIAGNOSIS — B349 Viral infection, unspecified: Secondary | ICD-10-CM | POA: Insufficient documentation

## 2017-05-26 DIAGNOSIS — R0981 Nasal congestion: Secondary | ICD-10-CM

## 2017-05-26 MED ORDER — FLUTICASONE PROPIONATE 50 MCG/ACT NA SUSP: 2.0000 | Freq: Every day | NASAL | 0 refills | Status: DC

## 2017-05-26 MED ORDER — ONDANSETRON 4 MG PO TBDP
4.0000 mg | ORAL_TABLET | Freq: Once | ORAL | Status: AC
  Administered 2017-05-26: 4 mg via ORAL
  Filled 2017-05-26: qty 1

## 2017-05-26 MED ORDER — ONDANSETRON 4 MG PO TBDP: 4.0000 mg | ORAL_TABLET | Freq: Three times a day (TID) | ORAL | 0 refills | Status: DC | PRN

## 2017-05-26 MED ORDER — CETIRIZINE HCL 5 MG PO TABS: 5.0000 mg | ORAL_TABLET | Freq: Every day | ORAL | 0 refills | Status: DC

## 2017-05-26 NOTE — ED Triage Notes (Signed)
Pt reports she received flu shot on Dec. 6 and since has had cough, nasal congestion and intermittent fever. Pt had to leave job today and job in requiring a work note for return on Monday with medical clearance. Pt is [redacted] week pregnant.

## 2017-05-26 NOTE — ED Notes (Signed)
Pt states she began coughing today, feeling hot, lightheaded, weak with chills today. Pt reports she began vomiting yellowish thin liquid today after lunch and by this evening it was thicker and contained "some white stuff" also. Pt in NAD at this time. Pt left work d/t illness. Pt reports taking tylenol around 2pm today when she felt hot. Did not have a thermometer at work to take temp; no temp here in triage.

## 2017-05-26 NOTE — Discharge Instructions (Addendum)
Take the prescription meds as directed. Follow-up with your provider at [facility] for continued symptoms.

## 2017-05-26 NOTE — ED Notes (Signed)
First Nurse note:  Patient had a fever earlier today and didn't feel well. Patient left work early and they are now requiring her to have a work note for her to be able to return to work on Monday.

## 2017-05-26 NOTE — ED Notes (Signed)
Pt ambulatory upon discharge. Verbalized understanding of discharge instructions, prescriptions and follow-up care. VSS. Skin warm and dry. A&O x4.

## 2017-05-26 NOTE — ED Provider Notes (Signed)
[facility] Emergency Department Provider Note ____________________________________________  Time seen: 2120  I have reviewed the triage vital signs and the nursing notes.  HISTORY  Chief Complaint  Cough and Nasal Congestion  HPI Briana James is a 36 y.o. female G3P2 presents to the ED for evaluation of cough, nasal drainage, and sinus congestion for the last few days. She reports subjective fevers today, described as chills. She reports similar symptoms in her young children. She recalls getting her flu vaccine from her OB provider on 12/6. She has taken a dose of Tylenol today, prior to arrival. She is 11-weeks gestation without any complaints related to the pregnancy at this time. She is requesting a work note for tomorrow.   History reviewed. No pertinent past medical history.  Patient Active Problem List   Diagnosis Date Noted  . Adjustment disorder with depressed mood 10/28/2014  . Depression 10/28/2014  . Wrist pain 10/28/2014  . Skin nodule 08/24/2013  . Breast mass, left 08/24/2000    Past Surgical History:  Procedure Laterality Date  . BREAST BIOPSY Right 2003-2004    Prior to Admission medications   Medication Sig Start Date End Date Taking? Authorizing Provider  cetirizine (ZYRTEC) 5 MG tablet Take 1 tablet (5 mg total) by mouth daily. 05/26/17   Geoff Dacanay, Dannielle Karvonen, PA-C  fluticasone (FLONASE) 50 MCG/ACT nasal spray Place 2 sprays into both nostrils daily. 05/26/17   Reneka Nebergall, Dannielle Karvonen, PA-C  ondansetron (ZOFRAN ODT) 4 MG disintegrating tablet Take 1 tablet (4 mg total) by mouth every 8 (eight) hours as needed for nausea or vomiting. 05/26/17   Nikholas Geffre, Dannielle Karvonen, PA-C   Allergies Patient has no known allergies.  History reviewed. No pertinent family history.  Social History Social History   Tobacco Use  . Smoking status: Never Smoker  . Smokeless tobacco: Never Used  Substance Use Topics  . Alcohol use: No  . Drug  use: No    Review of Systems  Constitutional: Negative for fever. Reports chills.  Eyes: Negative for eye drainage. ENT: Negative for sore throat. Reorts nasal drainage. Cardiovascular: Negative for chest pain. Respiratory: Negative for shortness of breath. Gastrointestinal: Negative for abdominal pain, vomiting and diarrhea. Genitourinary: Negative for dysuria. Denies vaginal bleeding or discharge Musculoskeletal: Negative for back pain. Skin: Negative for rash. Neurological: Negative for headaches, focal weakness or numbness. ____________________________________________  PHYSICAL EXAM:  VITAL SIGNS: ED Triage Vitals  Enc Vitals Group     BP 05/26/17 1916 121/90     Pulse Rate 05/26/17 1916 81     Resp 05/26/17 1916 16     Temp 05/26/17 1916 98.5 F (36.9 C)     Temp Source 05/26/17 1916 Oral     SpO2 05/26/17 1916 100 %     Weight 05/26/17 1917 170 lb (77.1 kg)     Height --      Head Circumference --      Peak Flow --      Pain Score 05/26/17 2019 3     Pain Loc --      Pain Edu? --      Excl. in [place]? --     Constitutional: Alert and oriented. Well appearing and in no distress. Head: Normocephalic and atraumatic. Eyes: Conjunctivae are normal. PERRL. Normal extraocular movements Ears: Canals clear. TMs intact bilaterally. Nose: No congestion/rhinorrhea/epistaxis. Moist nasal tubinates Mouth/Throat: Mucous membranes are moist. Uvula is midline and tonsils are flat Neck: Supple. No thyromegaly. Hematological/Lymphatic/Immunological: No cervical lymphadenopathy.  Cardiovascular: Normal rate, regular rhythm. Normal distal pulses. Respiratory: Normal respiratory effort. No wheezes/rales/rhonchi. Gastrointestinal: Soft and nontender. No distention. Skin:  Skin is warm, dry and intact. No rash noted. Psychiatric: Mood is anxious and affect is nervous. Patient exhibits appropriate insight and  judgment. ____________________________________________  PROCEDURES  Procedures Zofran 4 mg ODT ____________________________________________  INITIAL IMPRESSION / ASSESSMENT AND PLAN / ED COURSE  Patient with ED evaluation of nasal drainage, sinus congestion, and mild cough. Her exam is consistent with a mild viral URI. Because of her pregnancy status, she is advised of the safety of Tylenol, Flonase, and Benadryl or Zyrtec. Prescriptions are provided and she is discharged with a work note for tomorrow, as requested. She is reassured about the efficacy of these medications, including her flu vaccine with pregnancy.  ____________________________________________  FINAL CLINICAL IMPRESSION(S) / ED DIAGNOSES  Final diagnoses:  Nasal congestion  Viral upper respiratory tract infection      Carmie End, Dannielle Karvonen, PA-C 05/26/17 2231    Nance Pear, MD 05/26/17 2245

## 2018-12-18 ENCOUNTER — Encounter: Payer: Self-pay | Admitting: Emergency Medicine

## 2018-12-18 ENCOUNTER — Emergency Department
Admission: EM | Admit: 2018-12-18 | Discharge: 2018-12-18 | Disposition: A | Discharge status: Home / Self Care | Payer: Medicaid Other | Attending: Emergency Medicine | Admitting: Emergency Medicine

## 2018-12-18 ENCOUNTER — Emergency Department: Payer: Medicaid Other

## 2018-12-18 ENCOUNTER — Other Ambulatory Visit: Payer: Self-pay

## 2018-12-18 DIAGNOSIS — Z3A01 Less than 8 weeks gestation of pregnancy: Secondary | ICD-10-CM | POA: Diagnosis not present

## 2018-12-18 DIAGNOSIS — O4691 Antepartum hemorrhage, unspecified, first trimester: Secondary | ICD-10-CM | POA: Diagnosis not present

## 2018-12-18 DIAGNOSIS — Z79899 Other long term (current) drug therapy: Secondary | ICD-10-CM | POA: Insufficient documentation

## 2018-12-18 DIAGNOSIS — O4690 Antepartum hemorrhage, unspecified, unspecified trimester: Secondary | ICD-10-CM

## 2018-12-18 DIAGNOSIS — N939 Abnormal uterine and vaginal bleeding, unspecified: Secondary | ICD-10-CM

## 2018-12-18 LAB — CBC WITH DIFFERENTIAL/PLATELET
Abs Immature Granulocytes: 0.02 10*3/uL (ref 0.00–0.07)
Basophils Absolute: 0.1 10*3/uL (ref 0.0–0.1)
Basophils Relative: 1 %
Eosinophils Absolute: 0.1 10*3/uL (ref 0.0–0.5)
Eosinophils Relative: 2 %
HCT: 38.3 % (ref 36.0–46.0)
Hemoglobin: 13.1 g/dL (ref 12.0–15.0)
Immature Granulocytes: 0 %
Lymphocytes Relative: 35 %
Lymphs Abs: 2.4 10*3/uL (ref 0.7–4.0)
MCH: 30.5 pg (ref 26.0–34.0)
MCHC: 34.2 g/dL (ref 30.0–36.0)
MCV: 89.1 fL (ref 80.0–100.0)
Monocytes Absolute: 0.5 10*3/uL (ref 0.1–1.0)
Monocytes Relative: 7 %
Neutro Abs: 3.8 10*3/uL (ref 1.7–7.7)
Neutrophils Relative %: 55 %
Platelets: 202 10*3/uL (ref 150–400)
RBC: 4.3 MIL/uL (ref 3.87–5.11)
RDW: 12.6 % (ref 11.5–15.5)
WBC: 6.8 10*3/uL (ref 4.0–10.5)
nRBC: 0 % (ref 0.0–0.2)

## 2018-12-18 LAB — POCT PREGNANCY, URINE: Preg Test, Ur: POSITIVE — AB

## 2018-12-18 LAB — BASIC METABOLIC PANEL
Anion gap: 10 (ref 5–15)
BUN: 7 mg/dL (ref 6–20)
CO2: 22 mmol/L (ref 22–32)
Calcium: 9.2 mg/dL (ref 8.9–10.3)
Chloride: 104 mmol/L (ref 98–111)
Creatinine, Ser: 0.61 mg/dL (ref 0.44–1.00)
GFR calc Af Amer: >60 mL/min (ref >60)
GFR calc non Af Amer: >60 mL/min (ref >60)
Glucose, Bld: 125 mg/dL — ABNORMAL HIGH (ref 70–99)
Potassium: 3.9 mmol/L (ref 3.5–5.1)
Sodium: 136 mmol/L (ref 135–145)

## 2018-12-18 LAB — URINALYSIS, COMPLETE (UACMP) WITH MICROSCOPIC
Bilirubin Urine: NEGATIVE
Glucose, UA: NEGATIVE mg/dL
Ketones, ur: NEGATIVE mg/dL
Nitrite: NEGATIVE
Protein, ur: NEGATIVE mg/dL
Specific Gravity, Urine: 1.013 (ref 1.005–1.030)
pH: 6 (ref 5.0–8.0)

## 2018-12-18 LAB — HCG, QUANTITATIVE, PREGNANCY: hCG, Beta Chain, Quant, S: 43090 m[IU]/mL — ABNORMAL HIGH (ref <5)

## 2018-12-18 LAB — ABO/RH: ABO/RH(D): O POS

## 2018-12-18 NOTE — Discharge Instructions (Addendum)
Call your OB doctor to schedule follow-up for repeat hCG and repeat ultrasound given concerns that this may be a miscarriage.  Return to the ER for fevers or any other concerns.  1. Intrauterine gestational sac without a yolk sac or fetal pole. Differential considerations include pregnancy too early to detect versus blighted ovum/abortion in progress. Recommend clinical correlation, serial quantitative beta HCGs, and followup ultrasound as clinically indicated.

## 2018-12-18 NOTE — ED Triage Notes (Signed)
C?O vaginal bleeding x 1 day.  States sees blood on tissue after urination.  Also c/o low back pain x 1 day.  Patient is 10-[redacted] weeks pregnant.  LMP:  10/05/2018.  Has first prenatal care visit scheduled for 12/27/18 with [facility].  G4 P3

## 2018-12-18 NOTE — ED Notes (Signed)
Pt signed paper copy of discharge as signature pad in room does not work

## 2018-12-18 NOTE — ED Provider Notes (Signed)
[facility] Emergency Department Provider Note  ____________________________________________   First MD Initiated Contact with Patient 12/18/18 1034     (approximate)  I have reviewed the triage vital signs and the nursing notes.   HISTORY  Chief Complaint Vaginal Bleeding    HPI Briana James is a 38 y.o. female with prior history of miscarriage, 3 children who presents for vaginal bleeding.  Patient knew that she was pregnant however developed some vaginal bleeding that started last night.  This was very similar to when she had a miscarriage prior.  Denies any severe pain.  Patient's vaginal bleeding has been intermittent onset yesterday, mild, nothing makes it better, nothing makes it worse.  Denies any fevers.  Previously when she had a miscarriage she had to have a D&C.       History reviewed. No pertinent past medical history.  Patient Active Problem List   Diagnosis Date Noted   Adjustment disorder with depressed mood 10/28/2014   Depression 10/28/2014   Wrist pain 10/28/2014   Skin nodule 08/24/2013   Breast mass, left 08/24/2000    Past Surgical History:  Procedure Laterality Date   BREAST BIOPSY Right 2003-2004    Prior to Admission medications   Medication Sig Start Date End Date Taking? Authorizing Provider  cetirizine (ZYRTEC) 5 MG tablet Take 1 tablet (5 mg total) by mouth daily. 05/26/17   Menshew, Dannielle Karvonen, PA-C  fluticasone (FLONASE) 50 MCG/ACT nasal spray Place 2 sprays into both nostrils daily. 05/26/17   Menshew, Dannielle Karvonen, PA-C  ondansetron (ZOFRAN ODT) 4 MG disintegrating tablet Take 1 tablet (4 mg total) by mouth every 8 (eight) hours as needed for nausea or vomiting. 05/26/17   Menshew, Dannielle Karvonen, PA-C    Allergies Patient has no known allergies.  No family history on file.  Social History Social History   Tobacco Use   Smoking status: Never Smoker   Smokeless tobacco: Never Used    Substance Use Topics   Alcohol use: No   Drug use: No      Review of Systems Constitutional: No fever/chills Eyes: No visual changes. ENT: No sore throat. Cardiovascular: Denies chest pain. Respiratory: Denies shortness of breath. Gastrointestinal: No abdominal pain.  No nausea, no vomiting.  No diarrhea.  No constipation. Genitourinary: Positive vaginal bleeding Musculoskeletal: Negative for back pain. Skin: Negative for rash. Neurological: Negative for headaches, focal weakness or numbness. All other ROS negative ____________________________________________   PHYSICAL EXAM:  VITAL SIGNS: ED Triage Vitals  Enc Vitals Group     BP 12/18/18 0907 108/72     Pulse Rate 12/18/18 0907 68     Resp 12/18/18 0907 16     Temp 12/18/18 0907 98.9 F (37.2 C)     Temp Source 12/18/18 0907 Oral     SpO2 12/18/18 0907 99 %     Weight 12/18/18 0904 185 lb (83.9 kg)     Height 12/18/18 0904 5\' 2"  (1.575 m)     Head Circumference --      Peak Flow --      Pain Score 12/18/18 0903 5     Pain Loc --      Pain Edu? --      Excl. in [place]? --     Constitutional: Alert and oriented. Well appearing and in no acute distress. Eyes: Conjunctivae are normal. EOMI. Head: Atraumatic. Nose: No congestion/rhinnorhea. Mouth/Throat: Mucous membranes are moist.   Neck: No stridor. Trachea Midline. FROM Cardiovascular:  Normal rate, regular rhythm. Grossly normal heart sounds.  Good peripheral circulation. Respiratory: Normal respiratory effort.  No retractions. Lungs CTAB. Gastrointestinal: Soft and nontender. No distention. No abdominal bruits.  Musculoskeletal: No lower extremity tenderness nor edema.  No joint effusions. Neurologic:  Normal speech and language. No gross focal neurologic deficits are appreciated.  Skin:  Skin is warm, dry and intact. No rash noted. Psychiatric: Mood and affect are normal. Speech and behavior are normal. GU: Minimal vaginal bleeding, closed  cervix  ____________________________________________   LABS (all labs ordered are listed, but only abnormal results are displayed)  Labs Reviewed  HCG, QUANTITATIVE, PREGNANCY - Abnormal; Notable for the following components:      Result Value   hCG, Beta Chain, Quant, S 43,090 (*)    All other components within normal limits  BASIC METABOLIC PANEL - Abnormal; Notable for the following components:   Glucose, Bld 125 (*)    All other components within normal limits  URINALYSIS, COMPLETE (UACMP) WITH MICROSCOPIC - Abnormal; Notable for the following components:   Color, Urine YELLOW (*)    APPearance CLOUDY (*)    Hgb urine dipstick LARGE (*)    Leukocytes,Ua TRACE (*)    Bacteria, UA RARE (*)    All other components within normal limits  POCT PREGNANCY, URINE - Abnormal; Notable for the following components:   Preg Test, Ur POSITIVE (*)    All other components within normal limits  CBC WITH DIFFERENTIAL/PLATELET  POC URINE PREG, ED  ABO/RH   ____________________________________________  ____________________________________________  RADIOLOGY   Official radiology report(s): US Ob Comp Less 14 Wks  Result Date: 12/18/2018 CLINICAL DATA:  Vaginal bleeding EXAM: OBSTETRIC <14 WK [place] AND TRANSVAGINAL OB US TECHNIQUE: Both transabdominal and transvaginal ultrasound examinations were performed for complete evaluation of the gestation as well as the maternal uterus, adnexal regions, and pelvic cul-de-sac. Transvaginal technique was performed to assess early pregnancy. COMPARISON:  None. FINDINGS: Intrauterine gestational sac: Single Yolk sac:  Not Visualized. Embryo:  Not Visualized. Cardiac Activity: Not Visualized. MSD: 27  mm   7 w   5  d Subchorionic hemorrhage: Small subchorionic hemorrhage measuring 5 x 10 x 8 mm. Maternal uterus/adnexae: No adnexal mass.  No pelvic free fluid. IMPRESSION: 1. Intrauterine gestational sac without a yolk sac or fetal pole. Differential considerations  include pregnancy too early to detect versus blighted ovum/abortion in progress. Recommend clinical correlation, serial quantitative beta HCGs, and followup ultrasound as clinically indicated. Electronically Signed   By: Kathreen Devoid   On: 12/18/2018 12:03   US Ob Transvaginal  Result Date: 12/18/2018 CLINICAL DATA:  Vaginal bleeding EXAM: OBSTETRIC <14 WK [place] AND TRANSVAGINAL OB US TECHNIQUE: Both transabdominal and transvaginal ultrasound examinations were performed for complete evaluation of the gestation as well as the maternal uterus, adnexal regions, and pelvic cul-de-sac. Transvaginal technique was performed to assess early pregnancy. COMPARISON:  None. FINDINGS: Intrauterine gestational sac: Single Yolk sac:  Not Visualized. Embryo:  Not Visualized. Cardiac Activity: Not Visualized. MSD: 27  mm   7 w   5  d Subchorionic hemorrhage: Small subchorionic hemorrhage measuring 5 x 10 x 8 mm. Maternal uterus/adnexae: No adnexal mass.  No pelvic free fluid. IMPRESSION: 1. Intrauterine gestational sac without a yolk sac or fetal pole. Differential considerations include pregnancy too early to detect versus blighted ovum/abortion in progress. Recommend clinical correlation, serial quantitative beta HCGs, and followup ultrasound as clinically indicated. Electronically Signed   By: Kathreen Devoid   On: 12/18/2018  12:03    ____________________________________________   PROCEDURES  Procedure(s) performed (including Critical Care):  Procedures   ____________________________________________   INITIAL IMPRESSION / ASSESSMENT AND PLAN / ED COURSE   Yari Brant was evaluated in Emergency Department on 12/18/2018 for the symptoms described in the history of present illness. She was evaluated in the context of the global COVID-19 pandemic, which necessitated consideration that the patient might be at risk for infection with the SARS-CoV-2 virus that causes COVID-19. Institutional protocols and algorithms that  pertain to the evaluation of patients at risk for COVID-19 are in a state of rapid change based on information released by regulatory bodies including the CDC and federal and state organizations. These policies and algorithms were followed during the patient's care in the ED.    Vaginal bleeding pregnancy is most concerning for ectopic pregnancy, threatened abortion, complete abortion.  Will also get labs to evaluate for need of RhoGam.  Will get labs evaluate for anemia.  No evidence of infection.   Clinical Course as of Dec 17 1709  Tue Dec 18, 2018  1208 A beta-hCG of 43,000.   [MF]  1209 Ultrasound shows a fetal mass without any pole.   [MF]  1318 Patient O+ does not need RhoGam.  Patient Already has an OB that she will follow-up with.  Patient declined pain medications for discharge.   [MF]    Clinical Course User Index [MF] Vanessa [place], MD   Hemoglobin is normal  Given patient's hCG I would expect to see fetal pole at this time, therefore I am concerned about the possibility of a miscarriage.  Patient Already has an OB appointment for next week.  Patient instructed to call her OB to get earlier follow-up for repeat hCG and ultrasound.  Patient expressed understanding.  Patient understands that if her symptoms get a lot worse she should return to the ER.  Patient declines pain medication at this time.     ____________________________________________   FINAL CLINICAL IMPRESSION(S) / ED DIAGNOSES   Final diagnoses:  Vaginal bleeding  Vaginal bleeding in pregnancy      MEDICATIONS GIVEN DURING THIS VISIT:  Medications - No data to display   ED Discharge Orders    None       Note:  This document was prepared using Dragon voice recognition software and may include unintentional dictation errors.   Vanessa [place], MD 12/18/18 [phone]

## 2018-12-18 NOTE — ED Notes (Signed)
Pt in US at this time 

## 2018-12-18 NOTE — ED Notes (Signed)
Pt given gown and undressing

## 2018-12-18 NOTE — ED Notes (Signed)
EDP at bedside  

## 2018-12-19 ENCOUNTER — Ambulatory Visit: Payer: Self-pay

## 2018-12-19 VITALS — BP 106/73 | Ht 63.0 in | Wt 184.0 lb

## 2018-12-19 DIAGNOSIS — O209 Hemorrhage in early pregnancy, unspecified: Secondary | ICD-10-CM

## 2018-12-19 NOTE — Progress Notes (Signed)
Per provider order by C.Marzetta Board, [place] and after discussion with [facility] Coordinator, pt scheduled for lab only on 12/20/2018 to repeat a Beta HCG. Pt counseled that results from Beta HCG are normally back the next day. Once results are in, it will help determine suggestions for how to proceed.

## 2018-12-19 NOTE — Progress Notes (Signed)
Pt states she was at [facility] yesterday, they told her she was having a miscarriage. Pt was told to follow-up with [place]. Pt states this is her 2nd miscarriage; has 3 living children. Was trying to get pregnant this last time and wanted pregnancy, but now she wants to get on birth control to prevent pregnancies.

## 2018-12-19 NOTE — Progress Notes (Signed)
   Subjective:    Patient ID: Briana James, female    DOB: [birth year], 38 y.o.   MRN: [id number]  HPI  Client state that she has spotting/bleeding for few days.  Patient's last menstrual period was 10/05/2018 (exact date). She was seen @[facility]  12/18/2018 in the ED.  She had Beta HCG and [place].  Was instructed to seek repeat bloodwork and [place] 12/19/18 @ the health dept to f/u if she was having a miscarriage.   Client states that she is upset about miscarriage- per client this is her 2nd this year.  Client declines referral to [facility]- had counseling with her in her previous pregnancies.   Review of Systems  Genitourinary: Positive for vaginal bleeding.      Objective:   Physical Exam not indicated.  See [place] notes 12/18/2018.  Beta HCG-43,090;  [place]- gest sac noted no fetal pole/yoke sac noted     Assessment & Plan:  Client to be scheduled in [facility] clinic for 48 hr. Beta HCG bloodwork 12/19/2018. Co./reviewed [facility] instruction information with client today.  States she now understands the information given to her yesterday. Client encouraged to contact [facility] for counseling as needed.  Client agreed to contact if she felt that she needed to talk to [facility]

## 2018-12-19 NOTE — Progress Notes (Signed)
Chart opened to abstract as scheduled lab appt 12/20/2018. At this time, no info to abstract and encounter signed.

## 2018-12-20 ENCOUNTER — Other Ambulatory Visit: Payer: Self-pay

## 2018-12-20 ENCOUNTER — Ambulatory Visit: Payer: Self-pay | Admitting: Physician Assistant

## 2018-12-20 DIAGNOSIS — O200 Threatened abortion: Secondary | ICD-10-CM

## 2018-12-20 NOTE — Progress Notes (Signed)
In for f/u B. Hcg per Wilburn Mylar, FNP; advised to return to ED for severe pain or heavy bleeding. Debera Lat, RN

## 2018-12-20 NOTE — Progress Notes (Signed)
Here for lab visit only; not seen by provider.

## 2018-12-21 ENCOUNTER — Telehealth: Payer: Self-pay | Admitting: Family Medicine

## 2018-12-21 ENCOUNTER — Telehealth: Payer: Self-pay

## 2018-12-21 LAB — BETA HCG QUANT (REF LAB): hCG Quant: 25700 m[IU]/mL

## 2018-12-21 NOTE — Addendum Note (Signed)
Addended by: Caryl Bis on: 12/21/2018 12:26 PM   Modules accepted: Orders

## 2018-12-21 NOTE — Telephone Encounter (Signed)
Call to client, per Dr. Ernestina Patches & discussed lab result indicates failing pregnancy; reviewed if severe pain or heavy bleeding (over 1pad/hr) to go to ED; scheduled lab appt 12/26/18 for repeat B. Hcg;; interpreted by M. [place] ([place]).

## 2018-12-22 ENCOUNTER — Encounter: Payer: Self-pay | Admitting: Emergency Medicine

## 2018-12-22 ENCOUNTER — Other Ambulatory Visit: Payer: Self-pay

## 2018-12-22 ENCOUNTER — Emergency Department: Payer: Medicaid Other

## 2018-12-22 ENCOUNTER — Emergency Department
Admission: EM | Admit: 2018-12-22 | Discharge: 2018-12-22 | Disposition: A | Discharge status: Home / Self Care | Payer: Medicaid Other | Attending: Emergency Medicine | Admitting: Emergency Medicine

## 2018-12-22 DIAGNOSIS — O039 Complete or unspecified spontaneous abortion without complication: Secondary | ICD-10-CM | POA: Diagnosis not present

## 2018-12-22 DIAGNOSIS — Z3A00 Weeks of gestation of pregnancy not specified: Secondary | ICD-10-CM | POA: Insufficient documentation

## 2018-12-22 DIAGNOSIS — N939 Abnormal uterine and vaginal bleeding, unspecified: Secondary | ICD-10-CM

## 2018-12-22 DIAGNOSIS — Z79899 Other long term (current) drug therapy: Secondary | ICD-10-CM | POA: Diagnosis not present

## 2018-12-22 DIAGNOSIS — R102 Pelvic and perineal pain: Secondary | ICD-10-CM | POA: Diagnosis not present

## 2018-12-22 DIAGNOSIS — O209 Hemorrhage in early pregnancy, unspecified: Secondary | ICD-10-CM | POA: Diagnosis present

## 2018-12-22 LAB — COMPREHENSIVE METABOLIC PANEL
ALT: 33 U/L (ref 0–44)
AST: 40 U/L (ref 15–41)
Albumin: 3.8 g/dL (ref 3.5–5.0)
Alkaline Phosphatase: 71 U/L (ref 38–126)
Anion gap: 11 (ref 5–15)
BUN: 10 mg/dL (ref 6–20)
CO2: 19 mmol/L — ABNORMAL LOW (ref 22–32)
Calcium: 9.3 mg/dL (ref 8.9–10.3)
Chloride: 107 mmol/L (ref 98–111)
Creatinine, Ser: 0.69 mg/dL (ref 0.44–1.00)
GFR calc Af Amer: >60 mL/min (ref >60)
GFR calc non Af Amer: >60 mL/min (ref >60)
Glucose, Bld: 126 mg/dL — ABNORMAL HIGH (ref 70–99)
Potassium: 3.9 mmol/L (ref 3.5–5.1)
Sodium: 137 mmol/L (ref 135–145)
Total Bilirubin: 1.4 mg/dL — ABNORMAL HIGH (ref 0.3–1.2)
Total Protein: 7.5 g/dL (ref 6.5–8.1)

## 2018-12-22 LAB — CBC WITH DIFFERENTIAL/PLATELET
Abs Immature Granulocytes: 0.06 10*3/uL (ref 0.00–0.07)
Basophils Absolute: 0 10*3/uL (ref 0.0–0.1)
Basophils Relative: 0 %
Eosinophils Absolute: 0.1 10*3/uL (ref 0.0–0.5)
Eosinophils Relative: 1 %
HCT: 36.2 % (ref 36.0–46.0)
Hemoglobin: 12.5 g/dL (ref 12.0–15.0)
Immature Granulocytes: 1 %
Lymphocytes Relative: 22 %
Lymphs Abs: 2.8 10*3/uL (ref 0.7–4.0)
MCH: 30.7 pg (ref 26.0–34.0)
MCHC: 34.5 g/dL (ref 30.0–36.0)
MCV: 88.9 fL (ref 80.0–100.0)
Monocytes Absolute: 0.8 10*3/uL (ref 0.1–1.0)
Monocytes Relative: 6 %
Neutro Abs: 9.1 10*3/uL — ABNORMAL HIGH (ref 1.7–7.7)
Neutrophils Relative %: 70 %
Platelets: 216 10*3/uL (ref 150–400)
RBC: 4.07 MIL/uL (ref 3.87–5.11)
RDW: 12.4 % (ref 11.5–15.5)
WBC: 12.9 10*3/uL — ABNORMAL HIGH (ref 4.0–10.5)
nRBC: 0 % (ref 0.0–0.2)

## 2018-12-22 LAB — LACTIC ACID, PLASMA: Lactic Acid, Venous: 1.2 mmol/L (ref 0.5–1.9)

## 2018-12-22 LAB — TYPE AND SCREEN
ABO/RH(D): O POS
Antibody Screen: NEGATIVE

## 2018-12-22 LAB — HCG, QUANTITATIVE, PREGNANCY: hCG, Beta Chain, Quant, S: 12848 m[IU]/mL — ABNORMAL HIGH (ref <5)

## 2018-12-22 MED ORDER — MISOPROSTOL 200 MCG PO TABS
600.0000 ug | ORAL_TABLET | Freq: Once | ORAL | Status: AC
  Administered 2018-12-22: 600 ug via BUCCAL
  Filled 2018-12-22: qty 3

## 2018-12-22 MED ORDER — SODIUM CHLORIDE 0.9 % IV BOLUS
1000.0000 mL | Freq: Once | INTRAVENOUS | Status: AC
  Administered 2018-12-22: 1000 mL via INTRAVENOUS

## 2018-12-22 MED ORDER — FENTANYL CITRATE (PF) 100 MCG/2ML IJ SOLN
50.0000 ug | Freq: Once | INTRAMUSCULAR | Status: AC
  Administered 2018-12-22: 50 ug via INTRAVENOUS
  Filled 2018-12-22: qty 2

## 2018-12-22 MED ORDER — ONDANSETRON 4 MG PO TBDP: 4.0000 mg | ORAL_TABLET | Freq: Three times a day (TID) | ORAL | 0 refills | Status: DC | PRN

## 2018-12-22 MED ORDER — OXYCODONE-ACETAMINOPHEN 5-325 MG PO TABS: 1.0000 | ORAL_TABLET | ORAL | 0 refills | Status: DC | PRN

## 2018-12-22 MED ORDER — KETOROLAC TROMETHAMINE 30 MG/ML IJ SOLN
15.0000 mg | Freq: Once | INTRAMUSCULAR | Status: AC
  Administered 2018-12-22: 15 mg via INTRAVENOUS
  Filled 2018-12-22: qty 1

## 2018-12-22 MED ORDER — MORPHINE SULFATE (PF) 4 MG/ML IV SOLN
6.0000 mg | Freq: Once | INTRAVENOUS | Status: AC
  Administered 2018-12-22: 6 mg via INTRAVENOUS
  Filled 2018-12-22: qty 2

## 2018-12-22 MED ORDER — ONDANSETRON HCL 4 MG/2ML IJ SOLN
4.0000 mg | Freq: Once | INTRAMUSCULAR | Status: AC
  Administered 2018-12-22: 4 mg via INTRAVENOUS
  Filled 2018-12-22: qty 2

## 2018-12-22 MED ORDER — MISOPROSTOL 200 MCG PO TABS
600.0000 ug | ORAL_TABLET | Freq: Once | ORAL | Status: DC
  Filled 2018-12-22: qty 3

## 2018-12-22 MED ORDER — MORPHINE SULFATE (PF) 4 MG/ML IV SOLN: 4.0000 mg | Freq: Once | INTRAVENOUS | Status: DC

## 2018-12-22 NOTE — ED Triage Notes (Signed)
Pt to ED via POV c/o abdominal pain and vaginal bleeding. Pt states that she was told she was having miscarriage, pt was supposed to go back to her OBGYN for more blood work on Wednesday. Pt reports that last night she started having heavy bleeding and passing large clots. Pt appears uncomfortable in triage, tachycardic and hypotensive.

## 2018-12-22 NOTE — ED Notes (Signed)
Pt is aware that after being given medication that she will have increased cramping and bleeding - she is also aware that she will have to wait several hours prior to being discharged

## 2018-12-22 NOTE — ED Notes (Signed)
Patient transported to Ultrasound 

## 2018-12-22 NOTE — ED Notes (Signed)
Pt reports that she was [redacted] weeks pregnant and started bleeding - she went to PCP and had labs drawn and was told that pregnancy did not appear to be viable - she was to return to clinic Tuesday for more labs but last pm she started passing large blood clots and this am having severe abd/hip pain and cramping

## 2018-12-22 NOTE — ED Provider Notes (Signed)
[facility] Emergency Department Provider Note  ____________________________________________   First MD Initiated Contact with Patient 12/22/18 1345     (approximate)  I have reviewed the triage vital signs and the nursing notes.   HISTORY  Chief Complaint Abdominal Pain and Vaginal Bleeding    HPI Briana James is a 38 y.o. female here with vaginal bleeding.  The patient was recently seen on 7/7 and diagnosed with suspected miscarriage.  She states that she is had some intermittent abdominal bleeding and cramping since then and has had downgoing quants at her OB/GYN.   Starting overnight, she developed acute, severe, cramping, suprapubic and lower abdominal pain.  She had associated nausea.  She states the pain is severe and she is been passing large clots throughout the day today.  She said some associated lightheadedness and dizziness.  The pain is severe and too much for her to take, so she presents for evaluation.  Denies history of easy bruising or bleeding.  No blood thinners.  No syncope.  No other complaints.  No fevers or chills.       History reviewed. No pertinent past medical history.  Patient Active Problem List   Diagnosis Date Noted  . Adjustment disorder with depressed mood 10/28/2014  . Depression 10/28/2014  . Wrist pain 10/28/2014  . Skin nodule 08/24/2013  . Breast mass, left 08/24/2000    Past Surgical History:  Procedure Laterality Date  . BREAST BIOPSY Right 2003-2004    Prior to Admission medications   Medication Sig Start Date End Date Taking? Authorizing Provider  cetirizine (ZYRTEC) 5 MG tablet Take 1 tablet (5 mg total) by mouth daily. Patient not taking: Reported on 12/19/2018 05/26/17   Menshew, Dannielle Karvonen, PA-C  fluticasone (FLONASE) 50 MCG/ACT nasal spray Place 2 sprays into both nostrils daily. Patient not taking: Reported on 12/19/2018 05/26/17   Menshew, Dannielle Karvonen, PA-C  ondansetron (ZOFRAN ODT) 4 MG  disintegrating tablet Take 1 tablet (4 mg total) by mouth every 8 (eight) hours as needed for nausea or vomiting. 12/22/18   Duffy Bruce, MD  oxyCODONE-acetaminophen (PERCOCET) 5-325 MG tablet Take 1-2 tablets by mouth every 4 (four) hours as needed for moderate pain or severe pain. 12/22/18 12/22/19  Duffy Bruce, MD    Allergies Patient has no known allergies.  No family history on file.  Social History Social History   Tobacco Use  . Smoking status: Never Smoker  . Smokeless tobacco: Never Used  Substance Use Topics  . Alcohol use: No  . Drug use: No    Review of Systems  Review of Systems  Constitutional: Negative for fatigue and fever.  HENT: Negative for congestion and sore throat.   Eyes: Negative for visual disturbance.  Respiratory: Negative for cough and shortness of breath.   Cardiovascular: Negative for chest pain.  Gastrointestinal: Positive for abdominal pain. Negative for diarrhea, nausea and vomiting.  Genitourinary: Positive for vaginal bleeding and vaginal pain. Negative for flank pain.  Musculoskeletal: Negative for back pain and neck pain.  Skin: Negative for rash and wound.  Neurological: Negative for weakness.  All other systems reviewed and are negative.    ____________________________________________  PHYSICAL EXAM:      VITAL SIGNS: ED Triage Vitals  Enc Vitals Group     BP 12/22/18 1329 99/71     Pulse Rate 12/22/18 1329 (!) 131     Resp 12/22/18 1329 18     Temp 12/22/18 1329 99.3 F (37.4 C)  Temp src --      SpO2 12/22/18 1329 98 %     Weight 12/22/18 1330 185 lb (83.9 kg)     Height --      Head Circumference --      Peak Flow --      Pain Score 12/22/18 1329 10     Pain Loc --      Pain Edu? --      Excl. in [place]? --      Physical Exam Vitals signs and nursing note reviewed. Exam conducted with a chaperone present.  Constitutional:      General: She is not in acute distress.    Appearance: She is well-developed.   HENT:     Head: Normocephalic and atraumatic.  Eyes:     Conjunctiva/sclera: Conjunctivae normal.  Neck:     Musculoskeletal: Neck supple.  Cardiovascular:     Rate and Rhythm: Normal rate and regular rhythm.     Heart sounds: Normal heart sounds. No murmur. No friction rub.  Pulmonary:     Effort: Pulmonary effort is normal. No respiratory distress.     Breath sounds: Normal breath sounds. No wheezing or rales.  Abdominal:     General: There is no distension.     Palpations: Abdomen is soft.     Tenderness: There is no abdominal tenderness.  Genitourinary:    Comments: Large amount of dark red vaginal bleeding.  Multiple quarter size clots.  Office is soft and slightly opened, there are no visualized products are noted.  Uterus tender. Skin:    General: Skin is warm.     Capillary Refill: Capillary refill takes less than 2 seconds.     Findings: No rash.  Neurological:     Mental Status: She is alert and oriented to person, place, and time.     Motor: No abnormal muscle tone.       ____________________________________________   LABS (all labs ordered are listed, but only abnormal results are displayed)  Labs Reviewed  CBC WITH DIFFERENTIAL/PLATELET - Abnormal; Notable for the following components:      Result Value   WBC 12.9 (*)    Neutro Abs 9.1 (*)    All other components within normal limits  COMPREHENSIVE METABOLIC PANEL - Abnormal; Notable for the following components:   CO2 19 (*)    Glucose, Bld 126 (*)    Total Bilirubin 1.4 (*)    All other components within normal limits  HCG, QUANTITATIVE, PREGNANCY - Abnormal; Notable for the following components:   hCG, Beta Chain, Quant, S 12,848 (*)    All other components within normal limits  LACTIC ACID, PLASMA  LACTIC ACID, PLASMA  TYPE AND SCREEN    ____________________________________________  EKG: None ________________________________________  RADIOLOGY All imaging, including plain films, CT scans,  and ultrasounds, independently reviewed by me, and interpretations confirmed via formal radiology reads.  ED MD interpretation:   Ultrasound: Enlarged uterus with thickened endometrium. , No gestational sac noted,Likely ongoing spontaneous abortion.  Official radiology report(s): US Ob Comp Less 14 Wks  Result Date: 12/22/2018 CLINICAL DATA:  Vaginal bleeding. EXAM: OBSTETRIC <14 WK [place] AND TRANSVAGINAL OB US TECHNIQUE: Both transabdominal and transvaginal ultrasound examinations were performed for complete evaluation of the gestation as well as the maternal uterus, adnexal regions, and pelvic cul-de-sac. Transvaginal technique was performed to assess early pregnancy. COMPARISON:  December 18, 2018 FINDINGS: Intrauterine gestational sac: None Yolk sac:  Not Visualized. Embryo:  Not Visualized. Maternal uterus/adnexae:  Normal appearance of the bilateral ovaries. The uterus is enlarged measuring 5.7 x 6.7 x 6.7 cm for a volume of 287 cc. There is a 1.7 x 1.2 x 1.6 cm posterior body subserosal fibroid. The endometrium is thickened and heterogeneous with indistinct marginal zone, measuring on average 2.6 cm in thickness. Free fluid is seen within the endometrial canal in the lower uterine segment. No free fluid in the pelvis. IMPRESSION: Enlarged uterus with thickened heterogeneous endometrium and free fluid within the endometrial canal in the lower uterine segment. No gestational sac seen. Findings are consistent with ongoing spontaneous abortion. Normal appearance of the ovaries. No significant free fluid in the pelvis. These results were called by telephone at the time of interpretation on 12/22/2018 at 3:27 pm to Dr. Duffy Bruce , who verbally acknowledged these results. Electronically Signed   By: Fidela Salisbury M.D.   On: 12/22/2018 15:27   US Ob Transvaginal  Result Date: 12/22/2018 CLINICAL DATA:  Vaginal bleeding. EXAM: OBSTETRIC <14 WK [place] AND TRANSVAGINAL OB US TECHNIQUE: Both transabdominal and  transvaginal ultrasound examinations were performed for complete evaluation of the gestation as well as the maternal uterus, adnexal regions, and pelvic cul-de-sac. Transvaginal technique was performed to assess early pregnancy. COMPARISON:  December 18, 2018 FINDINGS: Intrauterine gestational sac: None Yolk sac:  Not Visualized. Embryo:  Not Visualized. Maternal uterus/adnexae: Normal appearance of the bilateral ovaries. The uterus is enlarged measuring 5.7 x 6.7 x 6.7 cm for a volume of 287 cc. There is a 1.7 x 1.2 x 1.6 cm posterior body subserosal fibroid. The endometrium is thickened and heterogeneous with indistinct marginal zone, measuring on average 2.6 cm in thickness. Free fluid is seen within the endometrial canal in the lower uterine segment. No free fluid in the pelvis. IMPRESSION: Enlarged uterus with thickened heterogeneous endometrium and free fluid within the endometrial canal in the lower uterine segment. No gestational sac seen. Findings are consistent with ongoing spontaneous abortion. Normal appearance of the ovaries. No significant free fluid in the pelvis. These results were called by telephone at the time of interpretation on 12/22/2018 at 3:27 pm to Dr. Duffy Bruce , who verbally acknowledged these results. Electronically Signed   By: Fidela Salisbury M.D.   On: 12/22/2018 15:27    ____________________________________________  PROCEDURES   Procedure(s) performed (including Critical Care):  Procedures  ____________________________________________  INITIAL IMPRESSION / MDM / [place] / ED COURSE  As part of my medical decision making, I reviewed the following data within the electronic MEDICAL RECORD NUMBER Notes from prior ED visits and [place] Controlled Substance Database      *Janei Scheff was evaluated in Emergency Department on 12/22/2018 for the symptoms described in the history of present illness. She was evaluated in the context of the global COVID-19 pandemic,  which necessitated consideration that the patient might be at risk for infection with the SARS-CoV-2 virus that causes COVID-19. Institutional protocols and algorithms that pertain to the evaluation of patients at risk for COVID-19 are in a state of rapid change based on information released by regulatory bodies including the CDC and federal and state organizations. These policies and algorithms were followed during the patient's care in the ED.  Some ED evaluations and interventions may be delayed as a result of limited staffing during the pandemic.*      Medical Decision Making: 38 year old female here with significant pelvic cramping and vaginal bleeding.  Suspect ongoing, incomplete abortion.  The office is closed visually that is soft  with palpable clot within the cervical canal.  Ultrasound shows expulsion of the gestational sac, and likely ongoing SAB.  Patient is afebrile with no signs to suggest septic abortion.  Case and ultrasound discussed with OB.  Will give a dose of Cytotec and analgesics.  Plan to monitor after dose, likely discharge if she remains hemodynamically stable and pain is tolerable.   No gestational sac, thick endometrium 2.6 cm ____________________________________________  FINAL CLINICAL IMPRESSION(S) / ED DIAGNOSES  Final diagnoses:  Spontaneous abortion     MEDICATIONS GIVEN DURING THIS VISIT:  Medications  misoprostol (CYTOTEC) tablet 600 mcg (has no administration in time range)  morphine 4 MG/ML injection 6 mg (has no administration in time range)  sodium chloride 0.9 % bolus 1,000 mL (1,000 mLs Intravenous New Bag/Given 12/22/18 1419)  fentaNYL (SUBLIMAZE) injection 50 mcg (50 mcg Intravenous Given 12/22/18 1420)  ondansetron (ZOFRAN) injection 4 mg (4 mg Intravenous Given 12/22/18 1420)  ketorolac (TORADOL) 30 MG/ML injection 15 mg (15 mg Intravenous Given 12/22/18 1420)     ED Discharge Orders         Ordered    oxyCODONE-acetaminophen (PERCOCET) 5-325  MG tablet  Every 4 hours PRN     12/22/18 1534    ondansetron (ZOFRAN ODT) 4 MG disintegrating tablet  Every 8 hours PRN     12/22/18 1534           Note:  This document was prepared using Dragon voice recognition software and may include unintentional dictation errors.   Duffy Bruce, MD 12/22/18 1540

## 2018-12-22 NOTE — ED Provider Notes (Signed)
Cytotec given without any acute reactions noted.   Earleen Newport, MD 12/22/18 1726

## 2018-12-24 ENCOUNTER — Encounter
Admission: EM | Disposition: A | Discharge status: Home / Self Care | Payer: Self-pay | Source: Home / Self Care | Attending: Emergency Medicine

## 2018-12-24 ENCOUNTER — Other Ambulatory Visit: Payer: Self-pay

## 2018-12-24 ENCOUNTER — Observation Stay
Admission: EM | Admit: 2018-12-24 | Discharge: 2018-12-26 | Disposition: A | Discharge status: Home / Self Care | Payer: Medicaid Other | Attending: Obstetrics & Gynecology | Admitting: Obstetrics & Gynecology

## 2018-12-24 ENCOUNTER — Emergency Department: Payer: Medicaid Other | Admitting: Anesthesiology

## 2018-12-24 ENCOUNTER — Telehealth: Payer: Self-pay | Admitting: Family Medicine

## 2018-12-24 ENCOUNTER — Encounter: Payer: Self-pay | Admitting: Emergency Medicine

## 2018-12-24 DIAGNOSIS — O034 Incomplete spontaneous abortion without complication: Principal | ICD-10-CM | POA: Insufficient documentation

## 2018-12-24 DIAGNOSIS — Z1159 Encounter for screening for other viral diseases: Secondary | ICD-10-CM | POA: Insufficient documentation

## 2018-12-24 DIAGNOSIS — D500 Iron deficiency anemia secondary to blood loss (chronic): Secondary | ICD-10-CM | POA: Diagnosis not present

## 2018-12-24 DIAGNOSIS — D649 Anemia, unspecified: Secondary | ICD-10-CM | POA: Diagnosis present

## 2018-12-24 DIAGNOSIS — R58 Hemorrhage, not elsewhere classified: Secondary | ICD-10-CM

## 2018-12-24 HISTORY — PX: DILATION AND EVACUATION: SHX1459

## 2018-12-24 LAB — CBC
HCT: 15.4 % — ABNORMAL LOW (ref 36.0–46.0)
Hemoglobin: 5.3 g/dL — ABNORMAL LOW (ref 12.0–15.0)
MCH: 31.5 pg (ref 26.0–34.0)
MCHC: 34.4 g/dL (ref 30.0–36.0)
MCV: 91.7 fL (ref 80.0–100.0)
Platelets: 196 10*3/uL (ref 150–400)
RBC: 1.68 MIL/uL — ABNORMAL LOW (ref 3.87–5.11)
RDW: 13.2 % (ref 11.5–15.5)
WBC: 13.4 10*3/uL — ABNORMAL HIGH (ref 4.0–10.5)
nRBC: 0.2 % (ref 0.0–0.2)

## 2018-12-24 LAB — COMPREHENSIVE METABOLIC PANEL
ALT: 17 U/L (ref 0–44)
AST: 18 U/L (ref 15–41)
Albumin: 3.2 g/dL — ABNORMAL LOW (ref 3.5–5.0)
Alkaline Phosphatase: 58 U/L (ref 38–126)
Anion gap: 10 (ref 5–15)
BUN: 10 mg/dL (ref 6–20)
CO2: 21 mmol/L — ABNORMAL LOW (ref 22–32)
Calcium: 8.4 mg/dL — ABNORMAL LOW (ref 8.9–10.3)
Chloride: 102 mmol/L (ref 98–111)
Creatinine, Ser: 0.61 mg/dL (ref 0.44–1.00)
GFR calc Af Amer: >60 mL/min (ref >60)
GFR calc non Af Amer: >60 mL/min (ref >60)
Glucose, Bld: 164 mg/dL — ABNORMAL HIGH (ref 70–99)
Potassium: 3.4 mmol/L — ABNORMAL LOW (ref 3.5–5.1)
Sodium: 133 mmol/L — ABNORMAL LOW (ref 135–145)
Total Bilirubin: 0.6 mg/dL (ref 0.3–1.2)
Total Protein: 6.2 g/dL — ABNORMAL LOW (ref 6.5–8.1)

## 2018-12-24 LAB — PREPARE RBC (CROSSMATCH)

## 2018-12-24 LAB — SARS CORONAVIRUS 2 BY RT PCR (HOSPITAL ORDER, PERFORMED IN ~~LOC~~ HOSPITAL LAB): SARS Coronavirus 2: NEGATIVE

## 2018-12-24 SURGERY — DILATION AND EVACUATION, UTERUS
Anesthesia: General

## 2018-12-24 MED ORDER — PHENYLEPHRINE HCL (PRESSORS) 10 MG/ML IV SOLN
INTRAVENOUS | Status: DC | PRN
  Administered 2018-12-24 (×2): 100 ug via INTRAVENOUS

## 2018-12-24 MED ORDER — METHYLERGONOVINE MALEATE 0.2 MG/ML IJ SOLN
INTRAMUSCULAR | Status: AC
  Filled 2018-12-24: qty 1

## 2018-12-24 MED ORDER — ONDANSETRON HCL 4 MG/2ML IJ SOLN
4.0000 mg | Freq: Four times a day (QID) | INTRAMUSCULAR | Status: DC | PRN
  Administered 2018-12-25 (×2): 4 mg via INTRAVENOUS
  Filled 2018-12-24 (×2): qty 2

## 2018-12-24 MED ORDER — SODIUM CHLORIDE 0.9 % IV SOLN
10.0000 mL/h | Freq: Once | INTRAVENOUS | Status: AC
  Administered 2018-12-24: 20:00:00 via INTRAVENOUS

## 2018-12-24 MED ORDER — IBUPROFEN 600 MG PO TABS
600.0000 mg | ORAL_TABLET | Freq: Four times a day (QID) | ORAL | Status: DC
  Administered 2018-12-25 – 2018-12-26 (×6): 600 mg via ORAL
  Filled 2018-12-24 (×6): qty 1

## 2018-12-24 MED ORDER — LIDOCAINE HCL (CARDIAC) PF 100 MG/5ML IV SOSY
PREFILLED_SYRINGE | INTRAVENOUS | Status: DC | PRN
  Administered 2018-12-24: 50 mg via INTRAVENOUS
  Administered 2018-12-24: 100 mg via INTRAVENOUS

## 2018-12-24 MED ORDER — ALBUMIN HUMAN 5 % IV SOLN
INTRAVENOUS | Status: AC
  Filled 2018-12-24: qty 500

## 2018-12-24 MED ORDER — METHYLERGONOVINE MALEATE 0.2 MG/ML IJ SOLN
INTRAMUSCULAR | Status: DC | PRN
  Administered 2018-12-24: 0.2 mg via INTRAMUSCULAR

## 2018-12-24 MED ORDER — ACETAMINOPHEN 500 MG PO TABS
1000.0000 mg | ORAL_TABLET | Freq: Four times a day (QID) | ORAL | Status: DC
  Administered 2018-12-24 – 2018-12-26 (×6): 1000 mg via ORAL
  Filled 2018-12-24 (×6): qty 2

## 2018-12-24 MED ORDER — OXYCODONE HCL 5 MG PO TABS
5.0000 mg | ORAL_TABLET | ORAL | Status: DC | PRN
  Administered 2018-12-24 – 2018-12-25 (×5): 5 mg via ORAL
  Administered 2018-12-26 (×2): 10 mg via ORAL
  Filled 2018-12-24: qty 2
  Filled 2018-12-24 (×4): qty 1
  Filled 2018-12-24: qty 2
  Filled 2018-12-24: qty 1

## 2018-12-24 MED ORDER — MENTHOL 3 MG MT LOZG
1.0000 | LOZENGE | OROMUCOSAL | Status: DC | PRN
  Filled 2018-12-24: qty 9

## 2018-12-24 MED ORDER — PROPOFOL 10 MG/ML IV BOLUS
INTRAVENOUS | Status: DC | PRN
  Administered 2018-12-24: 130 mg via INTRAVENOUS

## 2018-12-24 MED ORDER — FENTANYL CITRATE (PF) 100 MCG/2ML IJ SOLN
INTRAMUSCULAR | Status: AC
  Filled 2018-12-24: qty 2

## 2018-12-24 MED ORDER — ONDANSETRON HCL 4 MG/2ML IJ SOLN
INTRAMUSCULAR | Status: AC
  Filled 2018-12-24: qty 2

## 2018-12-24 MED ORDER — KETOROLAC TROMETHAMINE 15 MG/ML IJ SOLN
INTRAMUSCULAR | Status: AC
  Administered 2018-12-24: 15 mg
  Filled 2018-12-24: qty 1

## 2018-12-24 MED ORDER — MIDAZOLAM HCL 2 MG/2ML IJ SOLN
INTRAMUSCULAR | Status: AC
  Filled 2018-12-24: qty 2

## 2018-12-24 MED ORDER — FENTANYL CITRATE (PF) 100 MCG/2ML IJ SOLN
25.0000 ug | INTRAMUSCULAR | Status: DC | PRN
  Administered 2018-12-24 (×2): 25 ug via INTRAVENOUS

## 2018-12-24 MED ORDER — SUCCINYLCHOLINE CHLORIDE 20 MG/ML IJ SOLN
INTRAMUSCULAR | Status: DC | PRN
  Administered 2018-12-24: 100 mg via INTRAVENOUS

## 2018-12-24 MED ORDER — ONDANSETRON HCL 4 MG PO TABS: 4.0000 mg | ORAL_TABLET | Freq: Four times a day (QID) | ORAL | Status: DC | PRN

## 2018-12-24 MED ORDER — DOCUSATE SODIUM 100 MG PO CAPS
100.0000 mg | ORAL_CAPSULE | Freq: Two times a day (BID) | ORAL | Status: DC
  Administered 2018-12-25 – 2018-12-26 (×3): 100 mg via ORAL
  Filled 2018-12-24 (×3): qty 1

## 2018-12-24 MED ORDER — DOXYCYCLINE HYCLATE 100 MG PO TABS
200.0000 mg | ORAL_TABLET | Freq: Once | ORAL | Status: AC
  Administered 2018-12-24: 200 mg via ORAL
  Filled 2018-12-24: qty 2

## 2018-12-24 MED ORDER — MISOPROSTOL 200 MCG PO TABS
1000.0000 ug | ORAL_TABLET | Freq: Once | ORAL | Status: DC
  Filled 2018-12-24: qty 5

## 2018-12-24 MED ORDER — SODIUM CHLORIDE 0.9 % IV BOLUS
1000.0000 mL | Freq: Once | INTRAVENOUS | Status: AC
  Administered 2018-12-24: 1000 mL via INTRAVENOUS

## 2018-12-24 MED ORDER — ONDANSETRON HCL 4 MG/2ML IJ SOLN: 4.0000 mg | Freq: Once | INTRAMUSCULAR | Status: DC | PRN

## 2018-12-24 MED ORDER — LACTATED RINGERS IV SOLN
INTRAVENOUS | Status: DC
  Administered 2018-12-24: via INTRAVENOUS

## 2018-12-24 MED ORDER — SUCCINYLCHOLINE CHLORIDE 20 MG/ML IJ SOLN
INTRAMUSCULAR | Status: AC
  Filled 2018-12-24: qty 1

## 2018-12-24 MED ORDER — LIDOCAINE HCL (PF) 2 % IJ SOLN
INTRAMUSCULAR | Status: AC
  Filled 2018-12-24: qty 10

## 2018-12-24 MED ORDER — MIDAZOLAM HCL 2 MG/2ML IJ SOLN
INTRAMUSCULAR | Status: DC | PRN
  Administered 2018-12-24: 1 mg via INTRAVENOUS

## 2018-12-24 MED ORDER — PROPOFOL 10 MG/ML IV BOLUS
INTRAVENOUS | Status: AC
  Filled 2018-12-24: qty 20

## 2018-12-24 MED ORDER — SIMETHICONE 80 MG PO CHEW
160.0000 mg | CHEWABLE_TABLET | Freq: Four times a day (QID) | ORAL | Status: DC | PRN
  Administered 2018-12-25: 160 mg via ORAL
  Filled 2018-12-24: qty 2

## 2018-12-24 MED ORDER — KETOROLAC TROMETHAMINE 30 MG/ML IJ SOLN: 15.0000 mg | Freq: Once | INTRAMUSCULAR | Status: AC

## 2018-12-24 SURGICAL SUPPLY — 24 items
BASIN GRAD PLASTIC 32OZ STRL (MISCELLANEOUS) ×3 IMPLANT
CANISTER SUC SOCK COL 7IN (MISCELLANEOUS) IMPLANT
CATH ROBINSON RED A/P 16FR (CATHETERS) ×3 IMPLANT
COVER WAND RF STERILE (DRAPES) ×3 IMPLANT
FILTER UTR ASPR SPEC (MISCELLANEOUS) IMPLANT
FLTR UTR ASPR SPEC (MISCELLANEOUS)
GLOVE PI ORTHOPRO 6.5 (GLOVE) ×4
GLOVE PI ORTHOPRO STRL 6.5 (GLOVE) ×2 IMPLANT
GLOVE SURG SYN 6.5 ES PF (GLOVE) ×6 IMPLANT
GOWN STRL REUS W/ TWL LRG LVL3 (GOWN DISPOSABLE) ×2 IMPLANT
GOWN STRL REUS W/TWL LRG LVL3 (GOWN DISPOSABLE) ×4
KIT BERKELEY 1ST TRIMESTER 3/8 (MISCELLANEOUS) IMPLANT
KIT TURNOVER CYSTO (KITS) ×3 IMPLANT
NEEDLE SPNL 20GX3.5 QUINCKE YW (NEEDLE) IMPLANT
NS IRRIG 500ML POUR BTL (IV SOLUTION) IMPLANT
PACK DNC HYST (MISCELLANEOUS) ×3 IMPLANT
PAD OB MATERNITY 4.3X12.25 (PERSONAL CARE ITEMS) ×3 IMPLANT
PAD PREP 24X41 OB/GYN DISP (PERSONAL CARE ITEMS) ×3 IMPLANT
SET BERKELEY SUCTION TUBING (SUCTIONS) IMPLANT
SYR 10ML LL (SYRINGE) IMPLANT
TOWEL OR 17X26 4PK STRL BLUE (TOWEL DISPOSABLE) ×3 IMPLANT
VACURETTE 10 RIGID CVD (CANNULA) IMPLANT
VACURETTE 12 RIGID CVD (CANNULA) IMPLANT
VACURETTE 8 RIGID CVD (CANNULA) IMPLANT

## 2018-12-24 NOTE — Telephone Encounter (Signed)
Pt states she can hardly stand on her own in addition to other complaints documented in earlier note about her pain. Pt counseled that she needs to be evaluated by hospital. Pt states she does have someone to drive her. Pt states she will have someone drive her to ER.

## 2018-12-24 NOTE — ED Provider Notes (Signed)
[facility] Emergency Department Provider Note  ____________________________________________   I have reviewed the triage vital signs and the nursing notes.   HISTORY  Chief Complaint Weakness   History limited by: Not Limited   HPI Briana James is a 38 y.o. female who presents to the emergency department today because of concern for low blood level found at PCP today. The patient recently had a miscarriage. States that she has been having continued bleeding. Has had to change pads up to every 20-30 minutes. Has continued to pass clots. The patient says that she is now having dizziness and feels like she is going to pass out. Has had some shortness of breath. Denies any history of bleeding disorders.    Records reviewed. Per medical record review patient has a history of recent ER visit with diagnosis of miscarriage.   History reviewed. No pertinent past medical history.  Patient Active Problem List   Diagnosis Date Noted  . Adjustment disorder with depressed mood 10/28/2014  . Depression 10/28/2014  . Wrist pain 10/28/2014  . Skin nodule 08/24/2013  . Breast mass, left 08/24/2000    Past Surgical History:  Procedure Laterality Date  . BREAST BIOPSY Right 2003-2004    Prior to Admission medications   Medication Sig Start Date End Date Taking? Authorizing Provider  cetirizine (ZYRTEC) 5 MG tablet Take 1 tablet (5 mg total) by mouth daily. Patient not taking: Reported on 12/19/2018 05/26/17   Menshew, Dannielle Karvonen, PA-C  fluticasone (FLONASE) 50 MCG/ACT nasal spray Place 2 sprays into both nostrils daily. Patient not taking: Reported on 12/19/2018 05/26/17   Menshew, Dannielle Karvonen, PA-C  ondansetron (ZOFRAN ODT) 4 MG disintegrating tablet Take 1 tablet (4 mg total) by mouth every 8 (eight) hours as needed for nausea or vomiting. 12/22/18   Duffy Bruce, MD  oxyCODONE-acetaminophen (PERCOCET) 5-325 MG tablet Take 1-2 tablets by mouth every 4 (four)  hours as needed for moderate pain or severe pain. 12/22/18 12/22/19  Duffy Bruce, MD    Allergies Patient has no known allergies.  No family history on file.  Social History Social History   Tobacco Use  . Smoking status: Never Smoker  . Smokeless tobacco: Never Used  Substance Use Topics  . Alcohol use: No  . Drug use: No    Review of Systems Constitutional: No fever/chills Eyes: No visual changes. ENT: No sore throat. Cardiovascular: Denies chest pain. Respiratory: Positive for shortness of breath. Gastrointestinal: Positive for lower abdominal pain.  Genitourinary: Positive for vaginal bleeding.  Musculoskeletal: Negative for back pain. Skin: Negative for rash. Neurological: Positive for lightheadedness.  ____________________________________________   PHYSICAL EXAM:  VITAL SIGNS: ED Triage Vitals [12/24/18 1720]  Enc Vitals Group     BP 119/63     Pulse Rate (!) 120     Resp 18     Temp 98.7 F (37.1 C)     Temp Source Oral     SpO2 100 %     Weight      Height      Head Circumference      Peak Flow      Pain Score 7   Constitutional: Alert and oriented.  Eyes: Conjunctivae are normal.  ENT      Head: Normocephalic and atraumatic.      Nose: No congestion/rhinnorhea.      Mouth/Throat: Mucous membranes are moist.      Neck: No stridor. Hematological/Lymphatic/Immunilogical: No cervical lymphadenopathy. Cardiovascular: Tachycardia, regular rhythm.  No  murmurs, rubs, or gallops.  Respiratory: Normal respiratory effort without tachypnea nor retractions. Breath sounds are clear and equal bilaterally. No wheezes/rales/rhonchi. Gastrointestinal: Soft and non tender. No rebound. No guarding.  Genitourinary: Deferred Musculoskeletal: Normal range of motion in all extremities. No lower extremity edema. Neurologic:  Normal speech and language. No gross focal neurologic deficits are appreciated.  Skin:  Skin is warm, dry and intact. No rash  noted. Psychiatric: Mood and affect are normal. Speech and behavior are normal. Patient exhibits appropriate insight and judgment.  ____________________________________________    LABS (pertinent positives/negatives)  CBC wbc 13.4, hgb 5.3, plt 196 CMP na 133, k 3.4, glu 164, cr 0.61  ____________________________________________   EKG  None  ____________________________________________    RADIOLOGY  None  ____________________________________________   PROCEDURES  Procedures  CRITICAL CARE Performed by: Nance Pear   Total critical care time: 30 minutes  Critical care time was exclusive of separately billable procedures and treating other patients.  Critical care was necessary to treat or prevent imminent or life-threatening deterioration.  Critical care was time spent personally by me on the following activities: development of treatment plan with patient and/or surrogate as well as nursing, discussions with consultants, evaluation of patient's response to treatment, examination of patient, obtaining history from patient or surrogate, ordering and performing treatments and interventions, ordering and review of laboratory studies, ordering and review of radiographic studies, pulse oximetry and re-evaluation of patient's condition.  ____________________________________________   INITIAL IMPRESSION / ASSESSMENT AND PLAN / ED COURSE  Pertinent labs & imaging results that were available during my care of the patient were reviewed by me and considered in my medical decision making (see chart for details).   Patient presented to the emergency department today because of concerns for anemia found at outpatient labs.  Anemia likely secondary to hemorrhage related to recent miscarriage.  Patient was anemic to 5.3 today.  I discussed with Dr. Leonides Schanz with OB/GYN for possible D&C.  I also discussed with patient transfusion and risks and benefits of blood transfusion.  2 units  of blood were ordered.  Dr. Leonides Schanz did evaluate the patient will take patient for [facility].   ____________________________________________   FINAL CLINICAL IMPRESSION(S) / ED DIAGNOSES  Final diagnoses:  Anemia, unspecified type  Hemorrhage     Note: This dictation was prepared with Dragon dictation. Any transcriptional errors that result from this process are unintentional     Nance Pear, MD 12/25/18 1600

## 2018-12-24 NOTE — ED Notes (Signed)
Pt being transported to OR at this time. MD Ward at bedside

## 2018-12-24 NOTE — ED Notes (Addendum)
MD Ward at bedside, pt being educated on procedure and plan of care, pt signing consent for procedure

## 2018-12-24 NOTE — ED Notes (Signed)
PT states she is going through 1pad/30 min

## 2018-12-24 NOTE — Telephone Encounter (Signed)
PATIENT IS IN A LOT OF PAIN WAS AT HOSPITAL ON Saturday AND IS STILL HAVING A LOT OF PAIN FEELS LIKE HER HEAD IS GOING TO EXPLODE WOULD LIKE TOBE [place].

## 2018-12-24 NOTE — ED Notes (Signed)
MD Ward at bedside, speaking with pt. MD updated on plan.

## 2018-12-24 NOTE — Transfer of Care (Signed)
Immediate Anesthesia Transfer of Care Note  Patient: Briana James  Procedure(s) Performed: DILATATION AND EVACUATION (N/A )  Patient Location: PACU  Anesthesia Type:General  Level of Consciousness: sedated and patient cooperative  Airway & Oxygen Therapy: Patient Spontanous Breathing and Patient connected to face mask oxygen  Post-op Assessment: Report given to RN and Post -op Vital signs reviewed and stable  Post vital signs: Reviewed and stable  Last Vitals:  Vitals Value Taken Time  BP 85/45 12/24/18 2107  Temp    Pulse 117 12/24/18 2107  Resp 21 12/24/18 2107  SpO2 100 % 12/24/18 2107  Vitals shown include unvalidated device data.  Last Pain:  Vitals:   12/24/18 1935  TempSrc: Oral  PainSc:          Complications: No apparent anesthesia complications

## 2018-12-24 NOTE — ED Notes (Addendum)
Report given to OR at this time, pt ready for transport. COVID swab sent but orders to not wait on result per MD Ward. OR aware and orders acknowledged.

## 2018-12-24 NOTE — Op Note (Addendum)
Operative Report Dilation and Evacuation, Curettage 12/24/2018  Patient:  Briana James  38 y.o. female Preoperative diagnosis: incomplete abortion Postoperative diagnosis:  same, miscarriage with hemorrhage and profound anemia  PROCEDURE:  Procedure(s): DILATATION AND EVACUATION (N/A) Surgeon:  Surgeon(s) and Role:    * Mario Coronado, Honor Loh, MD - Primary Anesthesia:  GET I/O: Total I/O In: 1000 [I.V.:1000] Out: 100 [Blood:100] Specimens:  Endometrial curettings Complications: None Apparent Disposition:  VS stable to PACU  Findings: Uterus, mobile, top-normal size; normal appearing cervix dilated to 4cm internal os, large amounts of blood clots in the vagina, normal perineum, vulva. Copious tissue from endometrium.  COVID negative results prior to start of procedure.  Indication for procedure/Consents: 38 y.o. G5P3 presented to the ED with heavy bleeding 7 days after miscarriage diagnosed.  She had a hemoglobin of 12 two days ago, and today is 5.3.  Dilation and curettage was recommended. Risks of surgery were discussed with the patient including but not limited to: bleeding which may require transfusion; infection which may require antibiotics; injury to uterus or surrounding organs; intrauterine scarring which may impair future fertility; need for additional procedures including laparotomy or laparoscopy; and other postoperative/anesthesia complications. Written informed consent was obtained.    Procedure Details:   The patient was then taken to the operating room where anesthesia was administered and was found to be adequate.  After a formal timeout was performed, she was placed in the dorsal lithotomy position and examined with the above findings. She was then prepped and draped in the sterile manner.  A speculum was then placed in the patient's vagina.  Copious clotted blood was protruding from the cervix into the vagina.  A ringed forceps was used to tease out this tissue.  Once the  cervix was visible, a ringed forceps was applied to the anterior lip.     The cervix was widely dilated, and the ringed forceps were introduced and used to evacuate much tissue.  Then, a sharp curettage was then performed until there was a gritty texture in all four quadrants. The specimen was handed off to nursing.  The forceps adn vaginal speculum were removed after noting good hemostasis.  IM methergine was given to promote uterine and cervical tone and decrease post-procedure bleeding. The patient tolerated the procedure well and was taken to the recovery area awake, extubated and in stable condition.  The patient will be transferred to the floor for additional units of blood and overnight observation.    Larey Days, MD [facility]YN Attending Gynecologist

## 2018-12-24 NOTE — Anesthesia Preprocedure Evaluation (Signed)
Anesthesia Evaluation  Patient identified by MRN, date of birth, ID band Patient awake    Reviewed: Allergy & Precautions, NPO status , Patient's Chart, lab work & pertinent test results, reviewed documented beta blocker date and time   Airway Mallampati: II  TM Distance: >3 FB     Dental  (+) Chipped   Pulmonary           Cardiovascular      Neuro/Psych PSYCHIATRIC DISORDERS Depression    GI/Hepatic   Endo/Other    Renal/GU      Musculoskeletal   Abdominal   Peds  Hematology  (+) anemia ,   Anesthesia Other Findings Severe anemia. Dr. Leonides Schanz wants to proceed to stop the blood loss.  Reproductive/Obstetrics                             Anesthesia Physical Anesthesia Plan  ASA: III  Anesthesia Plan: General   Post-op Pain Management:    Induction: Intravenous  PONV Risk Score and Plan:   Airway Management Planned: Oral ETT and LMA  Additional Equipment:   Intra-op Plan:   Post-operative Plan:   Informed Consent: I have reviewed the patients History and Physical, chart, labs and discussed the procedure including the risks, benefits and alternatives for the proposed anesthesia with the patient or authorized representative who has indicated his/her understanding and acceptance.       Plan Discussed with: CRNA  Anesthesia Plan Comments:         Anesthesia Quick Evaluation

## 2018-12-24 NOTE — Anesthesia Postprocedure Evaluation (Signed)
Anesthesia Post Note  Patient: Briana James  Procedure(s) Performed: DILATATION AND EVACUATION (N/A )  Patient location during evaluation: PACU Anesthesia Type: General Level of consciousness: awake and alert Pain management: pain level controlled Vital Signs Assessment: post-procedure vital signs reviewed and stable Respiratory status: spontaneous breathing, nonlabored ventilation, respiratory function stable and patient connected to nasal cannula oxygen Cardiovascular status: blood pressure returned to baseline and stable Postop Assessment: no apparent nausea or vomiting Anesthetic complications: no     Last Vitals:  Vitals:   12/24/18 2218 12/24/18 2249  BP: (!) 89/60 (!) 87/58  Pulse: 99 98  Resp: 18 16  Temp: 36.8 C 36.8 C  SpO2: 95% 100%    Last Pain:  Vitals:   12/24/18 2249  TempSrc: Oral  PainSc:                  Delroy Ordway S

## 2018-12-24 NOTE — Consult Note (Addendum)
Consult History and Physical   SERVICE: Gynecology   Patient Name: Briana James Patient MRN:   [id number]  CC: heavy vaginal bleeding   HPI: Briana James is a 38 y.o. [phone] with recent confirmation of pregnancy who has had heavy bleeding with big clots since 12/18/18.  Location: vagina/uterus Onset/timing: 12/18/18 Duration: 7 days Quality: heavy bleeding Severity: severe Aggravating or alleviating conditions: nothing makes better or worse Associated signs/symptoms: +weakness, nausea, dizzyness, Headache.  No fevers, + chills, +cramping (improved) Context: Briana James returns to the ED today with 7 days of heavy bleeding from a miscarriage.  She had confirmation of pregnancy after missing her May period. She began bleeding on 7/7 and it has remained heavy since.  She has lost a significant amount of blood.  Her H/H 2 days ago was 12.5/36.2 and today is 5.3/15.4.  I have been asked to see her for intervention.   Beta HCG  12/18/2018 = 43,090 12/20/2018 = 25,700 12/22/2018 = 12,848  Blood type O+  G1 = 18yoM SVD G2 = 17yoM SVD (needing cardiac surgery) G3 = 7yoF SVD G4 = 1st trimester MAB with D&E G5 = current, 1st trimester Incomplete AB   She has had two COVID tests for work, and her son's surgery and they have both been negative.  Most recent negative test was 12/17/2018.  Our testing is pending.  Upon review of her imaging, it appears she has an anembryonic sac on 7/7 ultrasound and slightly heterogeneous surrounding tissues.  On 7/11 the sac appear to have migrated into the uterocervical junction.    Review of Systems: positives in bold GEN:   fevers, chills, weight changes, appetite changes, fatigue, night sweats HEENT:  HA, vision changes, hearing loss, congestion, rhinorrhea, sinus pressure, dysphagia CV:   CP, palpitations PULM:  SOB, cough GI:  abd pain, N/V/D/C GU:  dysuria, urgency, frequency MSK:  arthralgias, myalgias, back pain, swelling SKIN:  rashes, color changes,  pallor NEURO:  numbness, weakness, tingling, seizures, dizziness, tremors PSYCH:  depression, anxiety, behavioral problems, confusion  HEME/LYMPH:  easy bruising or bleeding ENDO:  heat/cold intolerance  Past Obstetrical History: OB History    Gravida  5   Para  3   Term      Preterm      AB      Living  3     SAB      TAB      Ectopic      Multiple      Live Births           Obstetric Comments  1st Menstrual Cycle:  14 1st Pregnancy:  18        Past Gynecologic History: Patient's last menstrual period was 10/05/2018 (exact date).    Past Medical History: History reviewed. No pertinent past medical history.  Past Surgical History:   Past Surgical History:  Procedure Laterality Date  . BREAST BIOPSY Right 2003-2004    Family History:  family history is not on file.  Social History:  Social History   Socioeconomic History  . Marital status: Single    Spouse name: Not on file  . Number of children: Not on file  . Years of education: Not on file  . Highest education level: Not on file  Occupational History  . Not on file  Social Needs  . Financial resource strain: Not on file  . Food insecurity    Worry: Not on file    Inability: Not on file  . Transportation  needs    Medical: Not on file    Non-medical: Not on file  Tobacco Use  . Smoking status: Never Smoker  . Smokeless tobacco: Never Used  Substance and Sexual Activity  . Alcohol use: No  . Drug use: No  . Sexual activity: Not on file  Lifestyle  . Physical activity    Days per week: Not on file    Minutes per session: Not on file  . Stress: Not on file  Relationships  . Social Herbalist on phone: Not on file    Gets together: Not on file    Attends religious service: Not on file    Active member of club or organization: Not on file    Attends meetings of clubs or organizations: Not on file    Relationship status: Not on file  . Intimate partner violence     Fear of current or ex partner: Not on file    Emotionally abused: Not on file    Physically abused: Not on file    Forced sexual activity: Not on file  Other Topics Concern  . Not on file  Social History Narrative  . Not on file    Home Medications:  Medications reconciled in EPIC  No current facility-administered medications on file prior to encounter.    Current Outpatient Medications on File Prior to Encounter  Medication Sig Dispense Refill  . cetirizine (ZYRTEC) 5 MG tablet Take 1 tablet (5 mg total) by mouth daily. (Patient not taking: Reported on 12/19/2018) 30 tablet 0  . fluticasone (FLONASE) 50 MCG/ACT nasal spray Place 2 sprays into both nostrils daily. (Patient not taking: Reported on 12/19/2018) 16 g 0  . ondansetron (ZOFRAN ODT) 4 MG disintegrating tablet Take 1 tablet (4 mg total) by mouth every 8 (eight) hours as needed for nausea or vomiting. 12 tablet 0  . oxyCODONE-acetaminophen (PERCOCET) 5-325 MG tablet Take 1-2 tablets by mouth every 4 (four) hours as needed for moderate pain or severe pain. 20 tablet 0    Allergies:  No Known Allergies  Physical Exam:  Temp:  [98.5 F (36.9 C)-98.7 F (37.1 C)] 98.5 F (36.9 C) (07/13 1935) Pulse Rate:  [101-120] 101 (07/13 1949) Resp:  [13-28] 20 (07/13 1949) BP: (97-119)/(56-68) 98/56 (07/13 1949) SpO2:  [100 %] 100 % (07/13 1949)   General Appearance:  Well developed, well nourished, no acute distress, alert and oriented, cooperative and appears stated age 27:  Normocephalic atraumatic, extraocular movements intact, moist mucous membranes, neck supple with midline trachea and thyroid without masses Cardiovascular:  Normal S1/S2, regular rate and rhythm, no murmurs, 2+ distal pulses Pulmonary:  clear to auscultation, no wheezes, rales or rhonchi, symmetric air entry, good air exchange Abdomen:  Bowel sounds present, soft, nontender, nondistended, no abnormal masses or organomegaly, no epigastric pain Extremities:   extremities normal, no tenderness, atraumatic, no cyanosis or edema Skin:  normal coloration and turgor, no rashes, no suspicious skin lesions noted  Neurologic:  Cranial nerves 2-12 grossly intact, grossly equal strength and muscle tone, normal speech, no focal findings or movement disorder noted. Psychiatric:  Normal mood and affect, appropriate, no AH/VH Pelvic:  Deferred to OR   Labs/Studies:   Results for orders placed or performed during the hospital encounter of 12/24/18 (from the past 24 hour(s))  Comprehensive metabolic panel     Status: Abnormal   Collection Time: 12/24/18  5:20 PM  Result Value Ref Range   Sodium 133 (L) 135 -  145 mmol/L   Potassium 3.4 (L) 3.5 - 5.1 mmol/L   Chloride 102 98 - 111 mmol/L   CO2 21 (L) 22 - 32 mmol/L   Glucose, Bld 164 (H) 70 - 99 mg/dL   BUN 10 6 - 20 mg/dL   Creatinine, Ser 0.61 0.44 - 1.00 mg/dL   Calcium 8.4 (L) 8.9 - 10.3 mg/dL   Total Protein 6.2 (L) 6.5 - 8.1 g/dL   Albumin 3.2 (L) 3.5 - 5.0 g/dL   AST 18 15 - 41 U/L   ALT 17 0 - 44 U/L   Alkaline Phosphatase 58 38 - 126 U/L   Total Bilirubin 0.6 0.3 - 1.2 mg/dL   GFR calc non Af Amer >60 >60 mL/min   GFR calc Af Amer >60 >60 mL/min   Anion gap 10 5 - 15  CBC     Status: Abnormal   Collection Time: 12/24/18  5:20 PM  Result Value Ref Range   WBC 13.4 (H) 4.0 - 10.5 K/uL   RBC 1.68 (L) 3.87 - 5.11 MIL/uL   Hemoglobin 5.3 (L) 12.0 - 15.0 g/dL   HCT 15.4 (L) 36.0 - 46.0 %   MCV 91.7 80.0 - 100.0 fL   MCH 31.5 26.0 - 34.0 pg   MCHC 34.4 30.0 - 36.0 g/dL   RDW 13.2 11.5 - 15.5 %   Platelets 196 150 - 400 K/uL   nRBC 0.2 0.0 - 0.2 %  Type and screen [facility]     Status: None (Preliminary result)   Collection Time: 12/24/18  5:20 PM  Result Value Ref Range   ABO/RH(D) O POS    Antibody Screen NEG    Sample Expiration 12/27/2018,2359    Unit Number [id number]    Blood Component Type RED CELLS,LR    Unit division 00    Status of Unit ALLOCATED     Transfusion Status OK TO TRANSFUSE    Crossmatch Result Compatible    Unit Number [id number]    Blood Component Type RED CELLS,LR    Unit division 00    Status of Unit ISSUED    Transfusion Status OK TO TRANSFUSE    Crossmatch Result      Compatible Performed at [facility], [address] [place], [place] [address]   Prepare RBC     Status: None   Collection Time: 12/24/18  6:58 PM  Result Value Ref Range   Order Confirmation      ORDER PROCESSED BY BLOOD BANK Performed at [facility], [address] [place], [place] [address]     Other Imaging: US Ob Comp Less 14 Wks  Result Date: 12/22/2018 CLINICAL DATA:  Vaginal bleeding. EXAM: OBSTETRIC <14 WK [place] AND TRANSVAGINAL OB US TECHNIQUE: Both transabdominal and transvaginal ultrasound examinations were performed for complete evaluation of the gestation as well as the maternal uterus, adnexal regions, and pelvic cul-de-sac. Transvaginal technique was performed to assess early pregnancy. COMPARISON:  December 18, 2018 FINDINGS: Intrauterine gestational sac: None Yolk sac:  Not Visualized. Embryo:  Not Visualized. Maternal uterus/adnexae: Normal appearance of the bilateral ovaries. The uterus is enlarged measuring 5.7 x 6.7 x 6.7 cm for a volume of 287 cc. There is a 1.7 x 1.2 x 1.6 cm posterior body subserosal fibroid. The endometrium is thickened and heterogeneous with indistinct marginal zone, measuring on average 2.6 cm in thickness. Free fluid is seen within the endometrial canal in the lower uterine segment. No free fluid in the  pelvis. IMPRESSION: Enlarged uterus with thickened heterogeneous endometrium and free fluid within the endometrial canal in the lower uterine segment. No gestational sac seen. Findings are consistent with ongoing spontaneous abortion. Normal appearance of the ovaries. No significant free fluid in the pelvis. These results were called by telephone at the time of interpretation on 12/22/2018 at 3:27  pm to Dr. Duffy Bruce , who verbally acknowledged these results. Electronically Signed   By: Fidela Salisbury M.D.   On: 12/22/2018 15:27   US Ob Comp Less 14 Wks  Result Date: 12/18/2018 CLINICAL DATA:  Vaginal bleeding EXAM: OBSTETRIC <14 WK [place] AND TRANSVAGINAL OB US TECHNIQUE: Both transabdominal and transvaginal ultrasound examinations were performed for complete evaluation of the gestation as well as the maternal uterus, adnexal regions, and pelvic cul-de-sac. Transvaginal technique was performed to assess early pregnancy. COMPARISON:  None. FINDINGS: Intrauterine gestational sac: Single Yolk sac:  Not Visualized. Embryo:  Not Visualized. Cardiac Activity: Not Visualized. MSD: 27  mm   7 w   5  d Subchorionic hemorrhage: Small subchorionic hemorrhage measuring 5 x 10 x 8 mm. Maternal uterus/adnexae: No adnexal mass.  No pelvic free fluid. IMPRESSION: 1. Intrauterine gestational sac without a yolk sac or fetal pole. Differential considerations include pregnancy too early to detect versus blighted ovum/abortion in progress. Recommend clinical correlation, serial quantitative beta HCGs, and followup ultrasound as clinically indicated. Electronically Signed   By: Kathreen Devoid   On: 12/18/2018 12:03      Assessment / Plan:   LAVITA PONTIUS is a 38 y.o. G5P3 who presents with incomplete miscarriage and profound anemia.  1. Cross-matched for 2 units, will check H/H afterward and add more units as needed 2. Consented to OR for D&C.  Risks and benefits reviewed.   Thank you for the opportunity to be involved with this patient's care.  ----- Larey Days, MD Attending Obstetrician and Gynecologist [facility], Department of [facility]

## 2018-12-24 NOTE — ED Notes (Signed)
MD Ward and RN Deneise Lever at bedside, Verbal order for transfuse blood at this time without pump. OR called and given report. Pt stable at this time.

## 2018-12-24 NOTE — Anesthesia Post-op Follow-up Note (Signed)
Anesthesia QCDR form completed.        

## 2018-12-24 NOTE — Anesthesia Procedure Notes (Signed)
Procedure Name: Intubation Date/Time: 12/24/2018 8:23 PM Performed by: Lendon Colonel, CRNA Pre-anesthesia Checklist: Patient identified, Patient being monitored, Timeout performed, Emergency Drugs available and Suction available Patient Re-evaluated:Patient Re-evaluated prior to induction Oxygen Delivery Method: Circle system utilized Preoxygenation: Pre-oxygenation with 100% oxygen Induction Type: IV induction, Cricoid Pressure applied and Rapid sequence Laryngoscope Size: Miller and 2 Grade View: Grade I Tube type: Oral Tube size: 7.0 mm Number of attempts: 1 Airway Equipment and Method: Stylet Placement Confirmation: ETT inserted through vocal cords under direct vision,  positive ETCO2 and breath sounds checked- equal and bilateral Secured at: 21 cm Tube secured with: Tape Dental Injury: Teeth and Oropharynx as per pre-operative assessment

## 2018-12-24 NOTE — ED Triage Notes (Addendum)
PT sent from [facility] for a drop in  Hg. Per paperwork hg is 5.1  PT has miscarriage last week, continued vaginal bleeding. PT appears weak. VSS . PT c/o dizziness and SOB .

## 2018-12-25 ENCOUNTER — Encounter: Payer: Self-pay | Admitting: Obstetrics & Gynecology

## 2018-12-25 LAB — CBC
HCT: 19.7 % — ABNORMAL LOW (ref 36.0–46.0)
Hemoglobin: 6.8 g/dL — ABNORMAL LOW (ref 12.0–15.0)
MCH: 30.5 pg (ref 26.0–34.0)
MCHC: 34.5 g/dL (ref 30.0–36.0)
MCV: 88.3 fL (ref 80.0–100.0)
Platelets: 129 10*3/uL — ABNORMAL LOW (ref 150–400)
RBC: 2.23 MIL/uL — ABNORMAL LOW (ref 3.87–5.11)
RDW: 13.7 % (ref 11.5–15.5)
WBC: 9.2 10*3/uL (ref 4.0–10.5)
nRBC: 0.4 % — ABNORMAL HIGH (ref 0.0–0.2)

## 2018-12-25 LAB — BASIC METABOLIC PANEL
Anion gap: 6 (ref 5–15)
BUN: 7 mg/dL (ref 6–20)
CO2: 20 mmol/L — ABNORMAL LOW (ref 22–32)
Calcium: 7.1 mg/dL — ABNORMAL LOW (ref 8.9–10.3)
Chloride: 112 mmol/L — ABNORMAL HIGH (ref 98–111)
Creatinine, Ser: 0.61 mg/dL (ref 0.44–1.00)
GFR calc Af Amer: >60 mL/min (ref >60)
GFR calc non Af Amer: >60 mL/min (ref >60)
Glucose, Bld: 127 mg/dL — ABNORMAL HIGH (ref 70–99)
Potassium: 3.3 mmol/L — ABNORMAL LOW (ref 3.5–5.1)
Sodium: 138 mmol/L (ref 135–145)

## 2018-12-25 LAB — PREPARE RBC (CROSSMATCH)

## 2018-12-25 LAB — HEMOGLOBIN AND HEMATOCRIT, BLOOD
HCT: 30.2 % — ABNORMAL LOW (ref 36.0–46.0)
Hemoglobin: 10.4 g/dL — ABNORMAL LOW (ref 12.0–15.0)

## 2018-12-25 MED ORDER — SODIUM CHLORIDE 0.9% IV SOLUTION: Freq: Once | INTRAVENOUS | Status: DC

## 2018-12-26 ENCOUNTER — Other Ambulatory Visit: Payer: Self-pay

## 2018-12-26 LAB — BPAM RBC
Blood Product Expiration Date: 202007132359
Blood Product Expiration Date: 202007142359
Blood Product Expiration Date: 202008102359
Blood Product Expiration Date: 202008112359
Blood Product Expiration Date: 202008112359
ISSUE DATE / TIME: 202007131928
ISSUE DATE / TIME: 202007132102
ISSUE DATE / TIME: 202007140415
ISSUE DATE / TIME: 202007140740
Unit Type and Rh: 5100
Unit Type and Rh: 5100
Unit Type and Rh: 5100
Unit Type and Rh: 9500
Unit Type and Rh: 9500

## 2018-12-26 LAB — TYPE AND SCREEN
ABO/RH(D): O POS
Antibody Screen: NEGATIVE
Unit division: 0
Unit division: 0
Unit division: 0
Unit division: 0
Unit division: 0

## 2018-12-26 MED ORDER — OXYCODONE HCL 5 MG PO TABS: 5.0000 mg | ORAL_TABLET | Freq: Four times a day (QID) | ORAL | 0 refills | Status: DC | PRN

## 2018-12-26 MED ORDER — IBUPROFEN 600 MG PO TABS: 600.0000 mg | ORAL_TABLET | Freq: Four times a day (QID) | ORAL | 0 refills | Status: DC

## 2018-12-26 MED ORDER — ACETAMINOPHEN 500 MG PO TABS: 1000.0000 mg | ORAL_TABLET | Freq: Four times a day (QID) | ORAL | 0 refills | Status: DC

## 2018-12-26 NOTE — Progress Notes (Signed)
Patient discharged home. Discharge instructions and prescriptions given and reviewed with patient. Patient verbalized understanding.  Escorted out by staff.   

## 2018-12-26 NOTE — Plan of Care (Signed)
Vs stable (BP still a little on the lower side of WNL); pt up ad lib with NO lightheadedness or dizziness; tolerating regular diet; taking tylenol, motrin and oxycodone for pain control; light bleeding on pad now

## 2018-12-26 NOTE — Discharge Summary (Addendum)
Gynecology Physician Postoperative Discharge Summary  Patient ID: Briana James MRN: [id number] DOB/AGE: 1980/08/23 38 y.o.  Admit Date: 12/24/2018 Discharge Date: 12/26/2018  Preoperative Diagnoses: incomplete abortion, uterine bleeding, profound anemia Postoperative Diagnoses:  same  Procedures: Procedure(s) (LRB): DILATATION AND EVACUATION (N/A)  CBC Latest Ref Rng & Units 12/25/2018 12/25/2018 12/24/2018  WBC 4.0 - 10.5 K/uL - 9.2 13.4(H)  Hemoglobin 12.0 - 15.0 g/dL 10.4(L) 6.8(L) 5.3(L)  Hematocrit 36.0 - 46.0 % 30.2(L) 19.7(L) 15.4(L)  Platelets 150 - 400 K/uL - 129(L) 196    Hospital Course:  Briana James is a 38 y.o. G5P3 presented to the ED with continued vaginal bleeding after miscarriage, and found to have a hemoglobin of 5.3.  She was brought to the OR for uterine evacuation.  She underwent the procedures as mentioned above, her operation was uncomplicated. For further details about surgery, please refer to the operative report. Patient received a total of 4 units of PRBCs, and otherwise had an uncomplicated postoperative course. By time of discharge on POD#2, her pain was controlled on oral pain medications; she was ambulating, voiding without difficulty, tolerating regular diet and passing flatus. She was deemed stable for discharge to home.   Discharge Exam: Blood pressure 98/70, pulse 83, temperature 98.7 F (37.1 C), temperature source Oral, resp. rate 20, height 5\' 2"  (1.575 m), weight 84.4 kg, last menstrual period 10/05/2018, SpO2 98 %, unknown if currently breastfeeding. General appearance: alert and no distress  Resp: clear to auscultation bilaterally, normal respiratory effort Cardio: regular rate and rhythm  GI: soft, non-tender; bowel sounds normal; no masses, no organomegaly.  Incision: C/D/I, no erythema, no drainage noted Pelvic: scant blood on pad  Extremities: extremities normal, atraumatic, no cyanosis or edema and Homans sign is negative, no sign of  DVT  Discharged Condition: Stable  Disposition:    Allergies as of 12/26/2018   No Known Allergies     Medication List    STOP taking these medications   cetirizine 5 MG tablet Commonly known as: ZYRTEC   fluticasone 50 MCG/ACT nasal spray Commonly known as: Flonase   ondansetron 4 MG disintegrating tablet Commonly known as: Zofran ODT   oxyCODONE-acetaminophen 5-325 MG tablet Commonly known as: Percocet     TAKE these medications   acetaminophen 500 MG tablet Commonly known as: TYLENOL Take 2 tablets (1,000 mg total) by mouth every 6 (six) hours.   ibuprofen 600 MG tablet Commonly known as: ADVIL Take 1 tablet (600 mg total) by mouth every 6 (six) hours.   oxyCODONE 5 MG immediate release tablet Commonly known as: Oxy IR/ROXICODONE Take 1 tablet (5 mg total) by mouth every 6 (six) hours as needed for severe pain.      Follow-up Information    Dierdre Mccalip, Honor Loh, MD. Go on 01/18/2019.   Specialty: Obstetrics and Gynecology Why: at [facility] information: [place] [address] [phone]           Signed:  Simms Attending [facility]

## 2018-12-26 NOTE — Discharge Instructions (Addendum)
°  ° ° °  [facility] [address] [place], [place]  [address] Phone:  [phone]   December 26, 2018  Patient: Briana James          To Whom It May Concern:  Please excuse [name] from work on December 25, 2018. His mother, Ommie Degeorge, was in the hospital.   If you have any questions or concerns, please don't hesitate to call.   Sincerely,   Treatment Team:  Attending Provider: Ward, Honor Loh, MD                       [place] [address] [place],   [address] Phone:  [phone]   December 26, 2018  Patient: Briana James          To Whom It May Concern:  [name] was seen and treated from July 13-December 26, 2018. She cannot lift anything heavier than 10 lb until her follow up appointment January 18, 2019.          If you have any questions or concerns, please don't hesitate to call.   Sincerely,   Treatment Team:  Attending Provider: Ward, Honor Loh, MD

## 2018-12-27 ENCOUNTER — Other Ambulatory Visit: Payer: Self-pay

## 2018-12-28 LAB — SURGICAL PATHOLOGY

## 2019-10-01 NOTE — Telephone Encounter (Signed)
Encounter opened in error

## 2021-06-25 ENCOUNTER — Other Ambulatory Visit: Payer: Self-pay | Admitting: Family Medicine

## 2021-06-25 DIAGNOSIS — Z3201 Encounter for pregnancy test, result positive: Secondary | ICD-10-CM

## 2021-06-30 ENCOUNTER — Ambulatory Visit
Admission: RE | Admit: 2021-06-30 | Discharge: 2021-06-30 | Disposition: A | Discharge status: Home / Self Care | Payer: Medicaid Other | Source: Ambulatory Visit | Attending: Family Medicine | Admitting: Family Medicine

## 2021-06-30 ENCOUNTER — Other Ambulatory Visit: Payer: Self-pay

## 2021-06-30 DIAGNOSIS — Z3201 Encounter for pregnancy test, result positive: Secondary | ICD-10-CM

## 2021-08-07 LAB — OB RESULTS CONSOLE HIV ANTIBODY (ROUTINE TESTING): HIV: NONREACTIVE

## 2021-08-07 LAB — OB RESULTS CONSOLE VARICELLA ZOSTER ANTIBODY, IGG: Varicella: IMMUNE

## 2021-08-07 LAB — OB RESULTS CONSOLE RPR: RPR: NONREACTIVE

## 2021-08-07 LAB — OB RESULTS CONSOLE HEPATITIS B SURFACE ANTIGEN: Hepatitis B Surface Ag: NEGATIVE

## 2021-08-07 LAB — OB RESULTS CONSOLE RUBELLA ANTIBODY, IGM: Rubella: IMMUNE

## 2021-09-25 LAB — OB RESULTS CONSOLE GC/CHLAMYDIA
Chlamydia: NEGATIVE
Neisseria Gonorrhea: NEGATIVE

## 2022-02-06 LAB — OB RESULTS CONSOLE GBS: GBS: NEGATIVE

## 2022-02-18 ENCOUNTER — Inpatient Hospital Stay: Payer: Medicaid Other | Admitting: Anesthesiology

## 2022-02-18 ENCOUNTER — Inpatient Hospital Stay
Admission: EM | Admit: 2022-02-18 | Discharge: 2022-02-21 | DRG: 784 | Disposition: A | Discharge status: Home / Self Care | Payer: Medicaid Other | Attending: Obstetrics | Admitting: Obstetrics

## 2022-02-18 ENCOUNTER — Other Ambulatory Visit: Payer: Self-pay

## 2022-02-18 ENCOUNTER — Encounter: Payer: Self-pay | Admitting: Obstetrics and Gynecology

## 2022-02-18 DIAGNOSIS — O9081 Anemia of the puerperium: Secondary | ICD-10-CM | POA: Diagnosis not present

## 2022-02-18 DIAGNOSIS — O24420 Gestational diabetes mellitus in childbirth, diet controlled: Secondary | ICD-10-CM | POA: Diagnosis present

## 2022-02-18 DIAGNOSIS — D62 Acute posthemorrhagic anemia: Secondary | ICD-10-CM | POA: Diagnosis not present

## 2022-02-18 DIAGNOSIS — Z3A39 39 weeks gestation of pregnancy: Secondary | ICD-10-CM | POA: Diagnosis not present

## 2022-02-18 DIAGNOSIS — Z3490 Encounter for supervision of normal pregnancy, unspecified, unspecified trimester: Principal | ICD-10-CM | POA: Diagnosis present

## 2022-02-18 DIAGNOSIS — Z302 Encounter for sterilization: Secondary | ICD-10-CM | POA: Diagnosis not present

## 2022-02-18 LAB — GLUCOSE, CAPILLARY
Glucose-Capillary: 104 mg/dL — ABNORMAL HIGH (ref 70–99)
Glucose-Capillary: 118 mg/dL — ABNORMAL HIGH (ref 70–99)
Glucose-Capillary: 99 mg/dL (ref 70–99)

## 2022-02-18 LAB — TYPE AND SCREEN
ABO/RH(D): O POS
Antibody Screen: NEGATIVE

## 2022-02-18 LAB — CBC
HCT: 36 % (ref 36.0–46.0)
Hemoglobin: 12.5 g/dL (ref 12.0–15.0)
MCH: 31.7 pg (ref 26.0–34.0)
MCHC: 34.7 g/dL (ref 30.0–36.0)
MCV: 91.4 fL (ref 80.0–100.0)
Platelets: 163 10*3/uL (ref 150–400)
RBC: 3.94 MIL/uL (ref 3.87–5.11)
RDW: 13.3 % (ref 11.5–15.5)
WBC: 6.9 10*3/uL (ref 4.0–10.5)
nRBC: 0 % (ref 0.0–0.2)

## 2022-02-18 MED ORDER — AMMONIA AROMATIC IN INHA
RESPIRATORY_TRACT | Status: AC
  Filled 2022-02-18: qty 10

## 2022-02-18 MED ORDER — MISOPROSTOL 200 MCG PO TABS
ORAL_TABLET | ORAL | Status: AC
  Filled 2022-02-18: qty 4

## 2022-02-18 MED ORDER — MISOPROSTOL 25 MCG QUARTER TABLET
25.0000 ug | ORAL_TABLET | Freq: Once | ORAL | Status: DC
  Filled 2022-02-18: qty 1

## 2022-02-18 MED ORDER — TERBUTALINE SULFATE 1 MG/ML IJ SOLN: 0.2500 mg | Freq: Once | INTRAMUSCULAR | Status: DC | PRN

## 2022-02-18 MED ORDER — LACTATED RINGERS IV SOLN: INTRAVENOUS | Status: DC

## 2022-02-18 MED ORDER — MISOPROSTOL 50MCG HALF TABLET
ORAL_TABLET | ORAL | Status: AC
  Administered 2022-02-18: 50 ug via ORAL
  Filled 2022-02-18: qty 1

## 2022-02-18 MED ORDER — OXYTOCIN-SODIUM CHLORIDE 30-0.9 UT/500ML-% IV SOLN: 2.5000 [IU]/h | INTRAVENOUS | Status: DC

## 2022-02-18 MED ORDER — EPHEDRINE 5 MG/ML INJ
10.0000 mg | INTRAVENOUS | Status: DC | PRN
Start: 2022-02-18 — End: 2022-02-19

## 2022-02-18 MED ORDER — OXYTOCIN 10 UNIT/ML IJ SOLN
INTRAMUSCULAR | Status: AC
  Filled 2022-02-18: qty 2

## 2022-02-18 MED ORDER — SOD CITRATE-CITRIC ACID 500-334 MG/5ML PO SOLN: 30.0000 mL | ORAL | Status: DC | PRN

## 2022-02-18 MED ORDER — ACETAMINOPHEN 325 MG PO TABS
650.0000 mg | ORAL_TABLET | ORAL | Status: DC | PRN
  Administered 2022-02-18: 650 mg via ORAL
  Filled 2022-02-18: qty 2

## 2022-02-18 MED ORDER — LACTATED RINGERS AMNIOINFUSION
INTRAVENOUS | Status: DC
  Filled 2022-02-18 (×2): qty 1000

## 2022-02-18 MED ORDER — LIDOCAINE HCL (PF) 1 % IJ SOLN: 30.0000 mL | INTRAMUSCULAR | Status: DC | PRN

## 2022-02-18 MED ORDER — LIDOCAINE HCL (PF) 1 % IJ SOLN
INTRAMUSCULAR | Status: AC
  Filled 2022-02-18: qty 30

## 2022-02-18 MED ORDER — DIPHENHYDRAMINE HCL 50 MG/ML IJ SOLN: 12.5000 mg | INTRAMUSCULAR | Status: DC | PRN

## 2022-02-18 MED ORDER — ONDANSETRON HCL 4 MG/2ML IJ SOLN: 4.0000 mg | Freq: Four times a day (QID) | INTRAMUSCULAR | Status: DC | PRN

## 2022-02-18 MED ORDER — MISOPROSTOL 50MCG HALF TABLET
50.0000 ug | ORAL_TABLET | Freq: Once | ORAL | Status: AC
Start: 2022-02-18 — End: 2022-02-18

## 2022-02-18 MED ORDER — FENTANYL CITRATE (PF) 100 MCG/2ML IJ SOLN: 50.0000 ug | INTRAMUSCULAR | Status: DC | PRN

## 2022-02-18 MED ORDER — FENTANYL-BUPIVACAINE-NACL 0.5-0.125-0.9 MG/250ML-% EP SOLN
12.0000 mL/h | EPIDURAL | Status: DC | PRN
  Administered 2022-02-18: 12 mL/h via EPIDURAL

## 2022-02-18 MED ORDER — PHENYLEPHRINE 80 MCG/ML (10ML) SYRINGE FOR IV PUSH (FOR BLOOD PRESSURE SUPPORT): 80.0000 ug | PREFILLED_SYRINGE | INTRAVENOUS | Status: DC | PRN

## 2022-02-18 MED ORDER — PHENYLEPHRINE 80 MCG/ML (10ML) SYRINGE FOR IV PUSH (FOR BLOOD PRESSURE SUPPORT)
80.0000 ug | PREFILLED_SYRINGE | INTRAVENOUS | Status: DC | PRN
Start: 2022-02-18 — End: 2022-02-19

## 2022-02-18 MED ORDER — LIDOCAINE HCL (PF) 1 % IJ SOLN
INTRAMUSCULAR | Status: DC | PRN
  Administered 2022-02-18: 5 mL via SUBCUTANEOUS

## 2022-02-18 MED ORDER — LACTATED RINGERS IV SOLN: 500.0000 mL | Freq: Once | INTRAVENOUS | Status: DC

## 2022-02-18 MED ORDER — OXYTOCIN BOLUS FROM INFUSION: 333.0000 mL | Freq: Once | INTRAVENOUS | Status: DC

## 2022-02-18 MED ORDER — FENTANYL-BUPIVACAINE-NACL 0.5-0.125-0.9 MG/250ML-% EP SOLN
EPIDURAL | Status: AC
  Filled 2022-02-18: qty 250

## 2022-02-18 MED ORDER — SODIUM CHLORIDE 0.9 % IV SOLN
INTRAVENOUS | Status: DC | PRN
  Administered 2022-02-18 (×2): 5 mL via EPIDURAL

## 2022-02-18 MED ORDER — LIDOCAINE-EPINEPHRINE (PF) 1.5 %-1:200000 IJ SOLN
INTRAMUSCULAR | Status: DC | PRN
  Administered 2022-02-18: 3 mL via EPIDURAL

## 2022-02-18 MED ORDER — OXYTOCIN-SODIUM CHLORIDE 30-0.9 UT/500ML-% IV SOLN
1.0000 m[IU]/min | INTRAVENOUS | Status: DC
  Administered 2022-02-18: 2 m[IU]/min via INTRAVENOUS
  Filled 2022-02-18: qty 500
  Filled 2022-02-18: qty 1000

## 2022-02-18 MED ORDER — LACTATED RINGERS IV SOLN: 500.0000 mL | INTRAVENOUS | Status: DC | PRN

## 2022-02-18 NOTE — Progress Notes (Signed)
Labor Check  Subj:  Complaints: resting in bed with eyes closed   Obj:  BP (!) 101/58   Pulse 70   Temp 98.3 F (36.8 C) (Oral)   Resp 19   Ht '5\' 1"'$  (1.549 m)   Wt 93.9 kg   LMP 05/17/2021   SpO2 99%   BMI 39.11 kg/m  Dose (milli-units/min) Oxytocin: 4 milli-units/min  Cervix: Dilation: 6.5 / Effacement (%): 60 / Station: -2  Baseline FHR: 125 beats/min   Variability: moderate   Accelerations: present   Decelerations: present, recurrent variable decelerations Contractions: present frequency: 2-3 Overall assessment: category 2  Female chaperone present for pelvic exam:   A/P: 41 y.o. [id number] female at 64w4dwith GDM A1 and AMA.  1.  Labor: patient laid down from epidural at 2330, variable decelerations noted after epidural placement. Pitocin turned off, 500cc LR bolus running and internal monitors placed with verbal consent from patient amnioinfusion started 2.  FWB: , Overall assessment: category 2  3.  GBS neg  4.  Pain: 0/10 5.  Recheck: 61.1/17/-3  FAvelino LeedsCNM 95/6/701411:56 PM

## 2022-02-18 NOTE — Progress Notes (Signed)
Labor Check  Subj:  Complaints: feeling pain with contractions   Obj:  BP 95/65 (BP Location: Right Arm)   Pulse 67   Temp 98.5 F (36.9 C) (Oral)   Resp 18   Ht '5\' 1"'$  (1.549 m)   Wt 93.9 kg   LMP 05/17/2021   SpO2 100%   BMI 39.11 kg/m     Cervix: Dilation: 6 / Effacement (%): 60, Thick / Station: -2  Baseline FHR: 135 beats/min   Variability: moderate   Accelerations: present   Decelerations: absent Contractions: present frequency: 2-4 Overall assessment: reassuring  Female chaperone present for pelvic exam:   A/P: 41 y.o. [id number] female at 4w4dwith GDM A1, and AMA.  1.  Labor: progressing normally, cooks cervical balloon out with a tug after 4 hours. AROM for clear fluid, Pitocin started at 2 mil/units  2.  FWB:  Overall assessment: category 1  3.  GBS neg  4.  Pain: 8/10 5.  Recheck: 66/43/-8  FAvelino LeedsCNM 93/8/18409:41 PM

## 2022-02-18 NOTE — Anesthesia Procedure Notes (Signed)
Epidural Patient location during procedure: OB Start time: 02/18/2022 10:50 PM End time: 02/18/2022 11:09 PM  Staffing Anesthesiologist: Martha Clan, MD Performed: anesthesiologist   Preanesthetic Checklist Completed: patient identified, IV checked, site marked, risks and benefits discussed, surgical consent, monitors and equipment checked, pre-op evaluation and timeout performed  Epidural Patient position: sitting Prep: ChloraPrep Patient monitoring: heart rate, continuous pulse ox and blood pressure Approach: midline Location: L3-L4 Injection technique: LOR saline  Needle:  Needle type: Tuohy  Needle gauge: 17 G Needle length: 9 cm and 9 Needle insertion depth: 7 cm Catheter type: closed end flexible Catheter size: 19 Gauge Catheter at skin depth: 12 cm Test dose: negative and 1.5% lidocaine with Epi 1:200 K  Assessment Sensory level: T10 Events: blood not aspirated, injection not painful, no injection resistance, no paresthesia and negative IV test  Additional Notes 1st attempt Pt. Evaluated and documentation done after procedure finished. Patient identified. Risks/Benefits/Options discussed with patient including but not limited to bleeding, infection, nerve damage, paralysis, failed block, incomplete pain control, headache, blood pressure changes, nausea, vomiting, reactions to medication both or allergic, itching and postpartum back pain. Confirmed with bedside nurse the patient's most recent platelet count. Confirmed with patient that they are not currently taking any anticoagulation, have any bleeding history or any family history of bleeding disorders. Patient expressed understanding and wished to proceed. All questions were answered. Sterile technique was used throughout the entire procedure. Please see nursing notes for vital signs. Test dose was given through epidural catheter and negative prior to continuing to dose epidural or start infusion. Warning signs of high  block given to the patient including shortness of breath, tingling/numbness in hands, complete motor block, or any concerning symptoms with instructions to call for help. Patient was given instructions on fall risk and not to get out of bed. All questions and concerns addressed with instructions to call with any issues or inadequate analgesia.    Patient tolerated the insertion well without immediate complications.Reason for block:procedure for pain

## 2022-02-18 NOTE — OB Triage Note (Signed)
Pt c/o ctx since last night. She went to [facility] hospital and was sent home and told to walk and come back if ctx got worse. She went back this morning at 8 am and was then told to walk and sent home after two hours of no cervical change. Pt does have some bloody show but it looks dark in color similar to after a few hours of cervical exams. Baseline FHT is 120-125 CAT I strip. No other complaints other than ctx. I havent captured any on the monitor for 10 mins

## 2022-02-18 NOTE — Anesthesia Preprocedure Evaluation (Signed)
Anesthesia Evaluation  Patient identified by MRN, date of birth, ID band Patient awake    Reviewed: Allergy & Precautions, H&P , NPO status , Patient's Chart, lab work & pertinent test results, reviewed documented beta blocker date and time   History of Anesthesia Complications Negative for: history of anesthetic complications  Airway Mallampati: II  TM Distance: >3 FB Neck ROM: full    Dental no notable dental hx.    Pulmonary neg pulmonary ROS,    Pulmonary exam normal breath sounds clear to auscultation       Cardiovascular Exercise Tolerance: Good negative cardio ROS Normal cardiovascular exam Rhythm:regular Rate:Normal     Neuro/Psych PSYCHIATRIC DISORDERS Depression negative neurological ROS     GI/Hepatic Neg liver ROS, GERD  ,  Endo/Other  diabetes  Renal/GU negative Renal ROS  negative genitourinary   Musculoskeletal   Abdominal   Peds  Hematology negative hematology ROS (+)   Anesthesia Other Findings History reviewed. No pertinent past medical history.   Reproductive/Obstetrics negative OB ROS                             Anesthesia Physical Anesthesia Plan  ASA: 3  Anesthesia Plan: Epidural   Post-op Pain Management:    Induction:   PONV Risk Score and Plan:   Airway Management Planned:   Additional Equipment:   Intra-op Plan:   Post-operative Plan:   Informed Consent: I have reviewed the patients History and Physical, chart, labs and discussed the procedure including the risks, benefits and alternatives for the proposed anesthesia with the patient or authorized representative who has indicated his/her understanding and acceptance.     Dental Advisory Given  Plan Discussed with: Anesthesiologist, CRNA and Surgeon  Anesthesia Plan Comments:         Anesthesia Quick Evaluation

## 2022-02-18 NOTE — H&P (Signed)
OB History & Physical   History of Present Illness:   Chief Complaint: IOL for GDMA1 and AMA  HPI:  Briana James is a 41 y.o. N2D7824 female at [redacted]w[redacted]d Patient's last menstrual period was 05/17/2021., consistent with UKoreaat 119w4dwith Estimated Date of Delivery: 02/21/22.  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 hemorrhoids  Patient Active Problem List   Diagnosis Date Noted   Encounter for induction of labor 02/18/2022   Profound anemia 12/24/2018   Adjustment disorder with depressed mood 10/28/2014   Depression 10/28/2014   Wrist pain 10/28/2014   Skin nodule 08/24/2013   Breast mass, left 08/24/2000    Prenatal Transfer Tool  Maternal Diabetes: Yes:  Diabetes Type:  Diet controlled Genetic Screening: Normal Maternal Ultrasounds/Referrals: Normal Fetal Ultrasounds or other Referrals:  None Maternal Substance Abuse:  No Significant Maternal Medications:  None Significant Maternal Lab Results: Group B Strep negative  Maternal Medical History:  History reviewed. No pertinent past medical history.  Past Surgical History:  Procedure Laterality Date   BREAST BIOPSY Right 2003-2004   DILATION AND EVACUATION N/A 12/24/2018   Procedure: DILATATION AND EVACUATION;  Surgeon: Ward, ChHonor LohMD;  Location: [facility] ORS;  Service: Gynecology;  Laterality: N/A;    No Known Allergies  Prior to Admission medications   Medication Sig Start Date End Date Taking? Authorizing Provider  acetaminophen (TYLENOL) 500 MG tablet Take 2 tablets (1,000 mg total) by mouth every 6 (six) hours. Patient not taking: Reported on 02/18/2022 12/26/18   Ward, ChHonor LohMD  ibuprofen (ADVIL) 600 MG tablet Take 1 tablet (600 mg total) by mouth every 6 (six) hours. Patient not taking: Reported on 02/18/2022 12/26/18   Ward,  ChHonor LohMD  oxyCODONE (OXY IR/ROXICODONE) 5 MG immediate release tablet Take 1 tablet (5 mg total) by mouth every 6 (six) hours as needed for severe pain. Patient not taking: Reported on 02/18/2022 12/26/18   Ward, ChHonor LohMD     Prenatal care site:  Ch[facility]ella IonOB History  Gravida Para Term Preterm AB Living  '6 3 3 '$ 0 0 3  SAB IAB Ectopic Multiple Live Births  0 0 0 0 0    # Outcome Date GA Lbr Len/2nd Weight Sex Delivery Anes PTL Lv  6 Current           5 Gravida           4 Gravida           3 Term           2 Term           1 Term             Obstetric Comments  1st Menstrual Cycle:  14  1st Pregnancy:  [age]   Social History: She  reports that she has never smoked. She has never used smokeless tobacco. She reports that she does not drink alcohol and does not use drugs.  Family History: family history is not on file.   Review of Systems: A full review of systems was performed and negative except as noted in the HPI.     Physical Exam:  Vital Signs: BP 103/76 (BP Location: Left Arm)   Pulse 66   Temp 98.8 F (37.1 C) (Oral)  Resp 17   Ht '5\' 1"'$  (1.549 m)   Wt 93.9 kg   LMP 05/17/2021   BMI 39.11 kg/m   General: no acute distress.  HEENT: normocephalic, atraumatic Heart: regular rate & rhythm Lungs: normal respiratory effort Abdomen: soft, gravid, non-tender;   Pelvic:   External: Normal external female genitalia  Cervix: Dilation: 2 / Effacement (%): 50 / Station: Ballotable    Extremities: non-tender, symmetric, no edema bilaterally.  DTRs: +2  Neurologic: Alert & oriented x 3.    Results for orders placed or performed during the hospital encounter of 02/18/22 (from the past 24 hour(s))  CBC     Status: None   Collection Time: 02/18/22  2:56 PM  Result Value Ref Range   WBC 6.9 4.0 - 10.5 K/uL   RBC 3.94 3.87 - 5.11 MIL/uL   Hemoglobin 12.5 12.0 - 15.0 g/dL   HCT 36.0 36.0 - 46.0 %   MCV 91.4 80.0 - 100.0 fL   MCH 31.7 26.0 - 34.0 pg   MCHC  34.7 30.0 - 36.0 g/dL   RDW 13.3 11.5 - 15.5 %   Platelets 163 150 - 400 K/uL   nRBC 0.0 0.0 - 0.2 %  Type and screen Sims     Status: None   Collection Time: 02/18/22  2:56 PM  Result Value Ref Range   ABO/RH(D) O POS    Antibody Screen NEG    Sample Expiration      02/21/2022,2359 Performed at [facility], [address] [place], [place] [address]   Glucose, capillary     Status: None   Collection Time: 02/18/22  2:59 PM  Result Value Ref Range   Glucose-Capillary 99 70 - 99 mg/dL    Pertinent Results:  Prenatal Labs: Blood type/Rh O POS   Antibody screen Negative    Rubella Immune (02/25 0000)   Varicella Immune  RPR Nonreactive (02/25 0000)   HBsAg Negative (02/25 0000)  Hep C NR   HIV Non-reactive (02/25 0000)   GC neg  Chlamydia neg  Genetic screening cfDNA negative   1 hour GTT 202  3 hour GTT [phone]  GBS Negative/-- (08/27 0000)    FHT:  FHR: 125 bpm, variability: moderate,  accelerations:  Present,  decelerations:  Absent Category/reactivity:  Category I UC:   occasional   Cephalic by Leopolds and SVE   No results found.  Assessment:  Briana James is a 41 y.o. G89P3003 female at 9w4dwith AMA and GDM A1.   Plan:  1. Admit to Labor & Delivery - consents reviewed and obtained - Dr. BLeafy Ronotified of admission and plan of care   2. Fetal Well being  - Fetal Tracing:  Category 1 - Group B Streptococcus ppx not indicated: GBS negative - Presentation: vertex confirmed by SVE   3. Routine OB: - Prenatal labs reviewed, as above - Rh positive - CBC, T&S, RPR on admit - Clear liquid diet , continuous IV fluids  4. Induction of labor  - Contractions monitored with external toco - Pelvis proven to  - Plan for induction with misoprostol and cervical balloon  - Induction with AROM and cervical balloon as appropriate  - Plan for  continuous fetal monitoring - Maternal pain control as desired; planning  regional anesthesia and IVPM - Anticipate vaginal delivery  5. Post Partum Planning: - Infant feeding: breast feeding - Contraception: bilateral tubal ligation - Tdap vaccine: Given prenatally - Flu vaccine:  n/a  [place], CNM 15/18/34 3:73 PM  [place] Certified Nurse Midwife [facility]

## 2022-02-19 ENCOUNTER — Other Ambulatory Visit: Payer: Self-pay

## 2022-02-19 ENCOUNTER — Encounter: Payer: Self-pay | Admitting: Obstetrics and Gynecology

## 2022-02-19 ENCOUNTER — Encounter
Admission: EM | Disposition: A | Discharge status: Home / Self Care | Payer: Self-pay | Source: Home / Self Care | Attending: Obstetrics

## 2022-02-19 LAB — CBC
HCT: 34.3 % — ABNORMAL LOW (ref 36.0–46.0)
Hemoglobin: 11.8 g/dL — ABNORMAL LOW (ref 12.0–15.0)
MCH: 31.4 pg (ref 26.0–34.0)
MCHC: 34.4 g/dL (ref 30.0–36.0)
MCV: 91.2 fL (ref 80.0–100.0)
Platelets: 143 10*3/uL — ABNORMAL LOW (ref 150–400)
RBC: 3.76 MIL/uL — ABNORMAL LOW (ref 3.87–5.11)
RDW: 13.4 % (ref 11.5–15.5)
WBC: 13.6 10*3/uL — ABNORMAL HIGH (ref 4.0–10.5)
nRBC: 0 % (ref 0.0–0.2)

## 2022-02-19 LAB — RPR: RPR Ser Ql: NONREACTIVE

## 2022-02-19 LAB — GLUCOSE, CAPILLARY
Glucose-Capillary: 89 mg/dL (ref 70–99)
Glucose-Capillary: 114 mg/dL — ABNORMAL HIGH (ref 70–99)

## 2022-02-19 SURGERY — Surgical Case
Anesthesia: Epidural

## 2022-02-19 MED ORDER — IBUPROFEN 600 MG PO TABS
600.0000 mg | ORAL_TABLET | Freq: Four times a day (QID) | ORAL | Status: DC
  Administered 2022-02-20 – 2022-02-21 (×5): 600 mg via ORAL
  Filled 2022-02-19 (×5): qty 1

## 2022-02-19 MED ORDER — MENTHOL 3 MG MT LOZG: 1.0000 | LOZENGE | OROMUCOSAL | Status: DC | PRN

## 2022-02-19 MED ORDER — NALOXONE HCL 4 MG/10ML IJ SOLN: 1.0000 ug/kg/h | INTRAVENOUS | Status: DC | PRN

## 2022-02-19 MED ORDER — DIPHENHYDRAMINE HCL 25 MG PO CAPS: 25.0000 mg | ORAL_CAPSULE | Freq: Four times a day (QID) | ORAL | Status: DC | PRN

## 2022-02-19 MED ORDER — LIDOCAINE HCL (PF) 2 % IJ SOLN
INTRAMUSCULAR | Status: AC
  Filled 2022-02-19: qty 5

## 2022-02-19 MED ORDER — MEPERIDINE HCL 25 MG/ML IJ SOLN: 6.2500 mg | INTRAMUSCULAR | Status: DC | PRN

## 2022-02-19 MED ORDER — NALOXONE HCL 0.4 MG/ML IJ SOLN: 0.4000 mg | INTRAMUSCULAR | Status: DC | PRN

## 2022-02-19 MED ORDER — KETOROLAC TROMETHAMINE 30 MG/ML IJ SOLN
30.0000 mg | Freq: Four times a day (QID) | INTRAMUSCULAR | Status: AC
  Administered 2022-02-19 (×3): 30 mg via INTRAVENOUS
  Filled 2022-02-19 (×3): qty 1

## 2022-02-19 MED ORDER — FENTANYL CITRATE (PF) 100 MCG/2ML IJ SOLN
INTRAMUSCULAR | Status: AC
  Filled 2022-02-19: qty 2

## 2022-02-19 MED ORDER — FERROUS SULFATE 325 (65 FE) MG PO TABS
325.0000 mg | ORAL_TABLET | Freq: Two times a day (BID) | ORAL | Status: DC
  Administered 2022-02-19 – 2022-02-21 (×5): 325 mg via ORAL
  Filled 2022-02-19 (×5): qty 1

## 2022-02-19 MED ORDER — DIPHENHYDRAMINE HCL 25 MG PO CAPS: 25.0000 mg | ORAL_CAPSULE | ORAL | Status: DC | PRN

## 2022-02-19 MED ORDER — FENTANYL CITRATE (PF) 100 MCG/2ML IJ SOLN
25.0000 ug | INTRAMUSCULAR | Status: DC | PRN
  Administered 2022-02-19: 50 ug via INTRAVENOUS
  Filled 2022-02-19: qty 2

## 2022-02-19 MED ORDER — LIDOCAINE 2% (20 MG/ML) 5 ML SYRINGE
INTRAMUSCULAR | Status: DC | PRN
  Administered 2022-02-19 (×2): 100 mg via INTRAVENOUS

## 2022-02-19 MED ORDER — SOD CITRATE-CITRIC ACID 500-334 MG/5ML PO SOLN
ORAL | Status: AC
  Administered 2022-02-19: 30 mL via ORAL
  Filled 2022-02-19: qty 15

## 2022-02-19 MED ORDER — KETOROLAC TROMETHAMINE 30 MG/ML IJ SOLN: 30.0000 mg | Freq: Four times a day (QID) | INTRAMUSCULAR | Status: DC | PRN

## 2022-02-19 MED ORDER — CEFAZOLIN SODIUM-DEXTROSE 2-4 GM/100ML-% IV SOLN
2.0000 g | Freq: Once | INTRAVENOUS | Status: AC
  Administered 2022-02-19: 2 g via INTRAVENOUS
  Filled 2022-02-19: qty 100

## 2022-02-19 MED ORDER — TETANUS-DIPHTH-ACELL PERTUSSIS 5-2.5-18.5 LF-MCG/0.5 IM SUSY
0.5000 mL | PREFILLED_SYRINGE | Freq: Once | INTRAMUSCULAR | Status: DC
  Filled 2022-02-19: qty 0.5

## 2022-02-19 MED ORDER — DIBUCAINE (PERIANAL) 1 % EX OINT: 1.0000 | TOPICAL_OINTMENT | CUTANEOUS | Status: DC | PRN

## 2022-02-19 MED ORDER — PROMETHAZINE HCL 25 MG/ML IJ SOLN: 6.2500 mg | INTRAMUSCULAR | Status: DC | PRN

## 2022-02-19 MED ORDER — FENTANYL CITRATE (PF) 100 MCG/2ML IJ SOLN
INTRAMUSCULAR | Status: DC | PRN
Start: 2022-02-19 — End: 2022-02-19
  Administered 2022-02-19: 100 ug via EPIDURAL

## 2022-02-19 MED ORDER — SIMETHICONE 80 MG PO CHEW: 80.0000 mg | CHEWABLE_TABLET | ORAL | Status: DC | PRN

## 2022-02-19 MED ORDER — OXYTOCIN-SODIUM CHLORIDE 30-0.9 UT/500ML-% IV SOLN
2.5000 [IU]/h | INTRAVENOUS | Status: AC
  Administered 2022-02-19 (×2): 2.5 [IU]/h via INTRAVENOUS

## 2022-02-19 MED ORDER — SODIUM CHLORIDE 0.9% FLUSH: 3.0000 mL | INTRAVENOUS | Status: DC | PRN

## 2022-02-19 MED ORDER — GABAPENTIN 300 MG PO CAPS
300.0000 mg | ORAL_CAPSULE | Freq: Every day | ORAL | Status: DC
  Administered 2022-02-19 – 2022-02-20 (×2): 300 mg via ORAL
  Filled 2022-02-19 (×2): qty 1

## 2022-02-19 MED ORDER — MORPHINE SULFATE (PF) 0.5 MG/ML IJ SOLN
INTRAMUSCULAR | Status: DC | PRN
Start: 2022-02-19 — End: 2022-02-19
  Administered 2022-02-19: 3 mg via EPIDURAL

## 2022-02-19 MED ORDER — OXYCODONE HCL 5 MG PO TABS: 5.0000 mg | ORAL_TABLET | Freq: Four times a day (QID) | ORAL | Status: DC | PRN

## 2022-02-19 MED ORDER — WITCH HAZEL-GLYCERIN EX PADS
1.0000 | MEDICATED_PAD | CUTANEOUS | Status: DC | PRN
Start: 2022-02-19 — End: 2022-02-21

## 2022-02-19 MED ORDER — ONDANSETRON HCL 4 MG/2ML IJ SOLN: 4.0000 mg | Freq: Three times a day (TID) | INTRAMUSCULAR | Status: DC | PRN

## 2022-02-19 MED ORDER — OXYTOCIN-SODIUM CHLORIDE 30-0.9 UT/500ML-% IV SOLN
INTRAVENOUS | Status: DC | PRN
  Administered 2022-02-19: 30 [IU] via INTRAVENOUS

## 2022-02-19 MED ORDER — FLEET ENEMA 7-19 GM/118ML RE ENEM: 1.0000 | ENEMA | Freq: Every day | RECTAL | Status: DC | PRN

## 2022-02-19 MED ORDER — LIDOCAINE HCL 2 % IJ SOLN
INTRAMUSCULAR | Status: AC
  Filled 2022-02-19: qty 10

## 2022-02-19 MED ORDER — BISACODYL 10 MG RE SUPP: 10.0000 mg | Freq: Every day | RECTAL | Status: DC | PRN

## 2022-02-19 MED ORDER — ONDANSETRON HCL 4 MG/2ML IJ SOLN
INTRAMUSCULAR | Status: DC | PRN
  Administered 2022-02-19: 4 mg via INTRAVENOUS

## 2022-02-19 MED ORDER — LACTATED RINGERS IV SOLN: INTRAVENOUS | Status: DC

## 2022-02-19 MED ORDER — BUPIVACAINE LIPOSOME 1.3 % IJ SUSP
INTRAMUSCULAR | Status: AC
  Filled 2022-02-19: qty 20

## 2022-02-19 MED ORDER — MORPHINE SULFATE (PF) 0.5 MG/ML IJ SOLN
INTRAMUSCULAR | Status: AC
  Filled 2022-02-19: qty 10

## 2022-02-19 MED ORDER — OXYCODONE HCL 5 MG PO TABS
5.0000 mg | ORAL_TABLET | ORAL | Status: DC | PRN
  Administered 2022-02-19 – 2022-02-20 (×2): 10 mg via ORAL
  Administered 2022-02-20: 5 mg via ORAL
  Administered 2022-02-21: 10 mg via ORAL
  Administered 2022-02-21: 5 mg via ORAL
  Filled 2022-02-19 (×3): qty 2
  Filled 2022-02-19 (×2): qty 1

## 2022-02-19 MED ORDER — ACETAMINOPHEN 500 MG PO TABS
1000.0000 mg | ORAL_TABLET | Freq: Four times a day (QID) | ORAL | Status: DC
  Administered 2022-02-19 – 2022-02-21 (×8): 1000 mg via ORAL
  Filled 2022-02-19 (×9): qty 2

## 2022-02-19 MED ORDER — SODIUM CHLORIDE 0.9 % IV SOLN
500.0000 mg | INTRAVENOUS | Status: DC
  Filled 2022-02-19: qty 5

## 2022-02-19 MED ORDER — COCONUT OIL OIL
1.0000 | TOPICAL_OIL | Status: DC | PRN
Start: 2022-02-19 — End: 2022-02-21
  Administered 2022-02-19: 1 via TOPICAL
  Filled 2022-02-19: qty 7.5

## 2022-02-19 MED ORDER — SENNOSIDES-DOCUSATE SODIUM 8.6-50 MG PO TABS
2.0000 | ORAL_TABLET | ORAL | Status: DC
  Administered 2022-02-19 – 2022-02-21 (×3): 2 via ORAL
  Filled 2022-02-19 (×3): qty 2

## 2022-02-19 MED ORDER — SIMETHICONE 80 MG PO CHEW
80.0000 mg | CHEWABLE_TABLET | Freq: Three times a day (TID) | ORAL | Status: DC
  Administered 2022-02-19 – 2022-02-21 (×7): 80 mg via ORAL
  Filled 2022-02-19 (×7): qty 1

## 2022-02-19 MED ORDER — MEASLES, MUMPS & RUBELLA VAC IJ SOLR
0.5000 mL | Freq: Once | INTRAMUSCULAR | Status: DC
  Filled 2022-02-19: qty 0.5

## 2022-02-19 MED ORDER — SOD CITRATE-CITRIC ACID 500-334 MG/5ML PO SOLN: 30.0000 mL | ORAL | Status: AC

## 2022-02-19 MED ORDER — BUPIVACAINE HCL (PF) 0.5 % IJ SOLN
INTRAMUSCULAR | Status: AC
  Filled 2022-02-19: qty 30

## 2022-02-19 MED ORDER — DIPHENHYDRAMINE HCL 50 MG/ML IJ SOLN: 12.5000 mg | INTRAMUSCULAR | Status: DC | PRN

## 2022-02-19 MED ORDER — SODIUM CHLORIDE (PF) 0.9 % IJ SOLN
INTRAMUSCULAR | Status: AC
  Filled 2022-02-19: qty 50

## 2022-02-19 MED ORDER — PRENATAL MULTIVITAMIN CH
1.0000 | ORAL_TABLET | Freq: Every day | ORAL | Status: DC
  Administered 2022-02-19 – 2022-02-21 (×3): 1 via ORAL
  Filled 2022-02-19 (×3): qty 1

## 2022-02-19 SURGICAL SUPPLY — 35 items
APL PRP STRL LF DISP 70% ISPRP (MISCELLANEOUS) ×1
CELL SAVER LIPIGURD (MISCELLANEOUS) ×1 IMPLANT
CHLORAPREP W/TINT 26 (MISCELLANEOUS) ×1 IMPLANT
DEVICE RETRIEVAL ALEXIS 14 (MISCELLANEOUS) IMPLANT
DRSG TELFA 3X8 NADH STRL (GAUZE/BANDAGES/DRESSINGS) ×1 IMPLANT
ELECT REM PT RETURN 9FT ADLT (ELECTROSURGICAL) ×1
ELECTRODE REM PT RTRN 9FT ADLT (ELECTROSURGICAL) ×1 IMPLANT
EXTRT SYSTEM ALEXIS 14CM (MISCELLANEOUS) ×1
GAUZE SPONGE 4X4 12PLY STRL (GAUZE/BANDAGES/DRESSINGS) ×1 IMPLANT
GOWN STRL REUS W/ TWL LRG LVL3 (GOWN DISPOSABLE) ×3 IMPLANT
GOWN STRL REUS W/TWL LRG LVL3 (GOWN DISPOSABLE) ×3
MANIFOLD NEPTUNE II (INSTRUMENTS) ×1 IMPLANT
MAT PREVALON FULL STRYKER (MISCELLANEOUS) ×1 IMPLANT
NDL HYPO 25GX1X1/2 BEV (NEEDLE) ×1 IMPLANT
NEEDLE HYPO 25GX1X1/2 BEV (NEEDLE) ×1 IMPLANT
NS IRRIG 1000ML POUR BTL (IV SOLUTION) ×1 IMPLANT
PACK C SECTION AR (MISCELLANEOUS) ×1 IMPLANT
PAD OB MATERNITY 4.3X12.25 (PERSONAL CARE ITEMS) ×1 IMPLANT
PAD PREP 24X41 OB/GYN DISP (PERSONAL CARE ITEMS) ×1 IMPLANT
SCRUB CHG 4% DYNA-HEX 4OZ (MISCELLANEOUS) ×1 IMPLANT
SUT MAXON ABS #0 GS21 30IN (SUTURE) IMPLANT
SUT MNCRL 4-0 (SUTURE) ×1
SUT MNCRL 4-0 27XMFL (SUTURE) ×1
SUT SILK 2 0 REEL (SUTURE) IMPLANT
SUT VIC AB 0 CT1 36 (SUTURE) ×2 IMPLANT
SUT VIC AB 0 CTX 36 (SUTURE) ×2
SUT VIC AB 0 CTX36XBRD ANBCTRL (SUTURE) ×2 IMPLANT
SUT VIC AB 1 CTX 36 (SUTURE) ×1
SUT VIC AB 1 CTX36XBRD ANBCTRL (SUTURE) IMPLANT
SUT VIC AB 2-0 SH 27 (SUTURE) ×2
SUT VIC AB 2-0 SH 27XBRD (SUTURE) ×2 IMPLANT
SUTURE MNCRL 4-0 27XMF (SUTURE) ×1 IMPLANT
SYR 30ML LL (SYRINGE) ×2 IMPLANT
TRAP FLUID SMOKE EVACUATOR (MISCELLANEOUS) ×1 IMPLANT
WATER STERILE IRR 500ML POUR (IV SOLUTION) ×1 IMPLANT

## 2022-02-19 NOTE — Progress Notes (Signed)
Abdominal binder given to pt, per request.   Nurse removed foley catheter at 1800. Pt showered and washed hair. Pt voided in toilet without the hat being there to measure. Explained to pt importance of measuring first 2 voids. Verbalized understanding.

## 2022-02-19 NOTE — Anesthesia Procedure Notes (Signed)
Date/Time: 02/19/2022 3:45 AM  Performed by: Jerrye Noble, CRNAPre-anesthesia Checklist: Patient identified, Emergency Drugs available, Suction available and Patient being monitored Patient Re-evaluated:Patient Re-evaluated prior to induction Oxygen Delivery Method: Nasal cannula

## 2022-02-19 NOTE — Transfer of Care (Signed)
Immediate Anesthesia Transfer of Care Note  Patient: Briana James  Procedure(s) Performed: CESAREAN SECTION  Patient Location: PACU and Mother/Baby  Anesthesia Type:Epidural  Level of Consciousness: awake, drowsy and patient cooperative  Airway & Oxygen Therapy: Patient Spontanous Breathing  Post-op Assessment: Report given to RN and Post -op Vital signs reviewed and stable  Post vital signs: Reviewed and stable  Last Vitals:  Vitals Value Taken Time  BP 96/67   Temp    Pulse 65   Resp 18   SpO2 99     Last Pain:  Vitals:   02/18/22 2351  TempSrc: Oral  PainSc:          Complications: No notable events documented.

## 2022-02-19 NOTE — Progress Notes (Addendum)
[id number] at 3w5dwith gDMA1 and AMA, who is now persistent cat II strip unresponsive to conservative measures. Continued mod variability but recurrent variable decels >50% of contractions, and occasional late decels. Amnioinfusion without result.  The risks of cesarean section discussed with the patient included but were not limited to: bleeding which may require transfusion or reoperation; infection which may require antibiotics; injury to bowel, bladder, ureters or other surrounding organs; injury to the fetus; need for additional procedures including hysterectomy in the event of a life-threatening hemorrhage; placental abnormalities wth subsequent pregnancies, incisional problems, thromboembolic phenomenon and other postoperative/anesthesia complications. The patient concurred with the proposed plan, giving informed written consent for the procedure.  Anesthesia and OR aware. Preoperative prophylactic antibiotics and SCDs ordered on call to the OR.  To OR when ready.  Patient desires permanent sterilization.  Other reversible forms of contraception were discussed with patient; she declines all other modalities. Risks of procedure discussed with patient including but not limited to: risk of regret, permanence of method, bleeding, infection, injury to surrounding organs and need for additional procedures.  Failure risk of 1-2 % with increased risk of ectopic gestation if pregnancy occurs was also discussed with patient.  Patient verbalized understanding of these risks and wants to proceed with sterilization.  Written informed consent obtained.  To OR when ready.  Tubal ligation

## 2022-02-19 NOTE — Discharge Instructions (Signed)
Discharge Instructions:   Follow-up Appointments:  For post-op incision check: Tuesday, September 26th at 11:45am with Dr. Leafy Ro at [facility]! 6wk postpartum follow-up: Thursday, October 26th at 3pm with Dr. Leafy Ro at [facility]!  If there are any new medications, they have been ordered and will be available for pickup at the listed pharmacy on your way home from the hospital.   Call office if you have any of the following: headache, visual changes, fever >101.0 F, chills, shortness of breath, breast concerns, excessive vaginal bleeding, incision drainage or problems, leg pain or redness, depression or any other concerns. If you have vaginal discharge with an odor, let your doctor know.   It is normal to bleed for up to 6 weeks. You should not soak through more than 1 pad in 1 hour. If you have a blood clot larger than your fist with continued bleeding, call your doctor.   After a c-section, you should expect a small amount of blood or clear fluid coming from the incision and abdominal cramping/soreness. Inspect your incision site daily. Stand in front of a mirror to look for any redness, incision opening, or discolored/odorness drainage. Take a shower daily and continue good hygiene. Use own towel and washcloth (do not share). Make sure your sheets on your bed are clean. No pets sleeping around your incision site. Dressing will be removed at your postpartum visit. If the dressing does become wet or soiled underneath, it is okay to remove it.   Activity: Do not lift > 10 lbs for 6 weeks (do not lift anything heavier than your baby). No intercourse, tampons, swimming pools, hot tubs, baths (only showers) for 6 weeks.  No driving for 1-2 weeks. Continue prenatal vitamin, especially if breastfeeding. Increase calories and fluids (water) while breastfeeding.   Your milk will come in, in the next couple of days (right now it is colostrum). You may have a slight fever when  your milk comes in, but it should go away on its own.  If it does not, and rises above 101 F please call the doctor. You will also feel achy and your breasts will be firm. They will also start to leak. If you are breastfeeding, continue as you have been and you can pump/express milk for comfort.   If you have too much milk, your breasts can become engorged, which could lead to mastitis. This is an infection of the milk ducts. It can be very painful and you will need to notify your doctor to obtain a prescription for antibiotics. You can also treat it with a shower or hot/cold compress.   For concerns about your baby, please call your pediatrician.  For breastfeeding concerns, the lactation consultant can be reached at [phone].   Postpartum blues (feelings of happy one minute and sad another minute) are normal for the first few weeks but if it gets worse let your doctor know.   Congratulations! We enjoyed caring for you and your new bundle of joy!

## 2022-02-19 NOTE — Discharge Summary (Signed)
Obstetrical Discharge Summary  Patient Name: Briana James DOB: Jul 03, 1980 MRN: [id number]  Date of Admission: 02/18/2022 Date of Delivery: 02/19/22 Delivered by: Benjaman Kindler, MD MPH Date of Discharge: 02/19/2022  Primary OB:  Princella Ion UXN:ATFTDDU'K last menstrual period was 05/17/2021. 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 hemorrhoids  Admitting Diagnosis: Encounter for induction of labor [Z34.90]  Secondary Diagnosis: Patient Active Problem List   Diagnosis Date Noted   Encounter for induction of labor 02/18/2022   Profound anemia 12/24/2018   Adjustment disorder with depressed mood 10/28/2014   Depression 10/28/2014   Wrist pain 10/28/2014   Skin nodule 08/24/2013   Breast mass, left 08/24/2000    Discharge Diagnosis: {DX.:23714}      Augmentation***: {{GURKYHCWCBJS:28315}Complications: {OB Labor/Delivery Complications:20784} Intrapartum complications/course: *** Delivery Type: {delivery type:32078} Anesthesia: {Treatments; birth pain:120016:x} Placenta: {Placenta delivery:32120::"spontaneous"} To Pathology: {YES/NO:21197::"No "} Laceration: {Laceration:32117} Episiotomy: none Newborn Data: Live born female  Birth Weight: 6 lb 10.9 oz (3030 g) APGAR: 8, 9  Newborn Delivery   Birth date/time: 02/19/2022 03:55:00 Delivery type: C-Section, Low Transverse Trial of labor: Yes C-section categorization: Primary      Postpartum Procedures: {postpartum procedures:3041411} [place]:      No data to display           Post partum course: ***   (Cesarean Section):  Patient had an uncomplicated postpartum course.  By time of discharge on POD#***, her pain was controlled on oral pain medications; she had appropriate lochia and was ambulating, voiding without difficulty, tolerating regular diet and passing flatus.   She was deemed stable for discharge  to home.    Discharge Physical Exam: *** BP (!) 135/59   Pulse 97   Temp 98.3 F (36.8 C) (Oral)   Resp 19   Ht '5\' 1"'$  (1.549 m)   Wt 93.9 kg   LMP 05/17/2021   SpO2 94%   Breastfeeding Yes   BMI 39.11 kg/m   General: NAD CV: RRR Pulm: CTABL, nl effort ABD: s/nd/nt, fundus firm and below the umbilicus Lochia: moderate Perineum:***minimal edema/{OB Perineal assessment:24215} Incision: c/d/I, covered with occlusive OP site dressing *** DVT Evaluation: LE non-ttp, no evidence of DVT on exam.  Hemoglobin  Date Value Ref Range Status  02/18/2022 12.5 12.0 - 15.0 g/dL Final   HGB  Date Value Ref Range Status  08/17/2011 12.3 12.0 - 16.0 g/dL Final   HCT  Date Value Ref Range Status  02/18/2022 36.0 36.0 - 46.0 % Final  08/18/2011 29.1 (L) 35.0 - 47.0 % Final    Risk assessment for postpartum VTE and prophylactic treatment: Very high risk factors: {KCVTEVHR:27790::"None"} High risk factors: Unscheduled cesarean after labor  Moderate risk factors: Cesarean delivery   Postpartum VTE prophylaxis with LMWH not indicated  Disposition: stable, discharge to home. Baby Feeding: breast and formula feeding Baby Disposition: home with mom  Rh Immune globulin indicated: {Yes/No:304960894::"No"} Rubella vaccine given: {ACTIONS; WAS GIVEN/WAS NOT INDICATED:16401::"was not indicated"} Varivax vaccine given: {ACTIONS; WAS GIVEN/WAS NOT INDICATED:16401::"was not indicated"} Flu vaccine given in AP setting: {YES/NO AS:20300::"Yes "} Tdap vaccine given in AP setting: {YES/NO AS:20300::"Yes "}  Contraception: {PLAN CONTRACEPTION:313102}  Prenatal Labs:  *** (copy from H&P)  Plan:  DShelsea Hangartnerwas discharged to home in good condition. Follow-up appointment with delivering provider in 6 weeks.***  Discharge Medications: Allergies as of 02/19/2022   No Known Allergies   Med Rec must be  completed prior to using this [facility]***        Follow-up Information     Benjaman Kindler, MD Follow up in 2 week(s).   Specialty: Obstetrics and Gynecology Why: For postop check Contact information: [address] [place] [address] [phone]                 Signed: *** Hit refresh and delete this line

## 2022-02-19 NOTE — Op Note (Signed)
Cesarean Section Procedure Note  Date of procedure: 02/19/2022   Pre-operative Diagnosis: Intrauterine pregnancy at [redacted]w[redacted]d  - fetal intolerance to labor - AMA - gDMA1 - failed induction of labor - undesired fertility  Post-operative Diagnosis: same, delivered.  Procedure: Primary Low Transverse Cesarean Section through Pfannenstiel incision  Surgeon: BBenjaman Kindler MD  Assistant(s):  FAvelino Leeds CNM   Anesthesia: Epidural anesthesia  Anesthesiologist: KMartha Clan MD Anesthesiologist: KMartha Clan MD CRNA: KJerrye Noble CRNA  Estimated Blood Loss:   6744m        Drains: foley         Total IV Fluids: 10007mUrine Output: 71m8m      Specimens: cord blood for maternal O pos, bilateral tubal sections         Complications:  None; patient tolerated the procedure well.         Disposition: PACU - hemodynamically stable.         Condition: stable  Findings:  A female infant in cephalic and ballotable presentation. Amniotic fluid - Clear  Birth weight 6lbs 11oz.  Apgars of 8 and 9 at one and five minutes respectively.  Intact placenta with a three-vessel cord.  Grossly normal uterus, tubes and ovaries bilaterally. No intraabdominal adhesions were noted.  Indications: 41yo38BO [id number] gDMA1 and AMA, who is now persistent cat II strip unresponsive to conservative measures. Continued mod variability but recurrent variable decels >50% of contractions, and occasional late decels. Amnioinfusion without result.  Procedure Details  The patient was taken to Operating Room, identified as the correct patient and the procedure verified as C-Section Delivery. A formal Time Out was held with all team members present and in agreement.  After induction of anesthesia, the patient was draped and prepped in the usual sterile manner. A Pfannenstiel skin incision was made and carried down through the subcutaneous tissue to the fascia. Fascial incision was made  and extended transversely with the Mayo scissors. The fascia was separated from the underlying rectus tissue superiorly and inferiorly. The peritoneum was identified and entered bluntly. Peritoneal incision was extended longitudinally. The utero-vesical peritoneal reflection was incised transversely and a bladder flap was created digitally.   A low transverse hysterotomy was made. The fetus was delivered atraumatically. The umbilical cord was clamped x2 and cut and the infant was handed to the awaiting pediatricians. The placenta was removed intact and appeared normal, intact, and with a 3-vessel cord.   The uterus was exteriorized and cleared of all clot and debris. The hysterotomy was closed with running sutures of 0-Vicryl.  Excellent hemostasis was observed. The peritoneal cavity was cleared of all clots and debris.   Attention was then turned to the tubal ligation. The right fallopian tube distinguished from the round ligament by identifying the fimbria and was grasped with a Babcock clamp in the midisthmic portion approximately 3 cm from the cornual region. It was then doubly ligated with 0-plain gut suture in a Parkland fashion. The tubal segment was excised with Metzenbaum scissors. Tubal ostea noted. The procedure was repeated on the left side. Fimbrea removed in a separate tie. Multiple bleeding areas were controlled with figure of 8 sutures. *Care was noted to examine both tubal sites in situ to ensure the sutures were intact and no bleeding was noted. The uterus was returned to the abdomen.  The pelvis was irrigated and again, excellent hemostasis was noted. The fascia was then reapproximated with running sutures of 0 Vicryl. The  subcutaneous tissue was reapproximated with running sutures of 0 Vicry. The skin was reapproximated with a 4-0 Monocryl subcuticular stitch.  16m (in 30 of 0.5% bupivicaine and 515mof NSS) of liposomal bupivicaine placed in the fascial and skin lines.  Instrument,  sponge, and needle counts were correct prior to the abdominal closure and at the conclusion of the case.   The patient tolerated the procedure well and was transferred to the recovery room in stable condition.   BeBenjaman KindlerMD 02/19/2022

## 2022-02-19 NOTE — Progress Notes (Signed)
Labor Check  Subj:  Complaints: denies feeling pain with contractions   Obj:  BP (!) 135/59   Pulse 66   Temp 98.3 F (36.8 C) (Oral)   Resp 19   Ht '5\' 1"'$  (1.549 m)   Wt 93.9 kg   LMP 05/17/2021   SpO2 97%   BMI 39.11 kg/m  Dose (milli-units/min) Oxytocin: 4 milli-units/min  Cervix: Dilation: 7.5 / Effacement (%): 90 / Station: -2  Baseline FHR: 140 beats/min   Variability: moderate   Accelerations: present   Decelerations: present, early and intermittent variables Contractions: present frequency: 3-4 Overall assessment: Category 2  Female chaperone present for pelvic exam:   A/P: 41 y.o. M0H6808 female at 58w5dwith GDM A1 and AMA.  1.  Labor: category 2 tracing with intermittent and repetitive variable decelerations. Amnioinfusion running. IUPC and FSE intact. Pitocin remains off d/t fetal intolerance. Maternal position changes. Cervix remains unchanged from prior check. Will reassess and restart Pitocin when able.  2.  FWB:  Overall assessment: category 2  3.  GBS neg  4.  Pain: 0/10 5.  Recheck: 7/60/-2  6. Discussed with patient/ FOB possibility of C-Section d/t to fetal intolerance if unable to correct recurrent variables  Dr BLeafy Ronotified and aware of labor events  FAvelino LeedsCDoheny Endosurgical Center Inc98/1/10311:09 AM

## 2022-02-20 NOTE — Lactation Note (Signed)
This note was copied from a baby's chart. Lactation Consultation Note  Patient Name: Briana James HYIFO'Y Date: 02/20/2022   Age:41 hours  Mother has requested LC return in am. Mom has visitors now and declines [facility] visit.  Consult Status  Follow-up  in am 02/21/22    Jonna Camp Gopal 02/20/2022, 5:04 PM

## 2022-02-20 NOTE — Anesthesia Post-op Follow-up Note (Signed)
  Anesthesia Pain Follow-up Note  Patient: Yena Tisby  Day #: 1  Date of Follow-up: 02/20/2022 Time: 2:40 PM  Last Vitals:  Vitals:   02/20/22 0400 02/20/22 0840  BP:  111/76  Pulse:  83  Resp:  18  Temp:  36.7 C  SpO2: 95% 98%    Level of Consciousness: alert  Pain: mild   Side Effects:None  Catheter Site Exam:clean, dry     Plan: D/C from anesthesia care at surgeon's request  Martha Clan

## 2022-02-20 NOTE — Anesthesia Postprocedure Evaluation (Signed)
Anesthesia Post Note  Patient: Briana James  Procedure(s) Performed: CESAREAN SECTION  Patient location during evaluation: Mother Baby Anesthesia Type: Epidural Level of consciousness: awake and alert Pain management: pain level controlled Vital Signs Assessment: post-procedure vital signs reviewed and stable Respiratory status: spontaneous breathing, nonlabored ventilation and respiratory function stable Cardiovascular status: stable Postop Assessment: no headache, no backache, able to ambulate and adequate PO intake Anesthetic complications: no   No notable events documented.   Last Vitals:  Vitals:   02/20/22 0400 02/20/22 0840  BP:  111/76  Pulse:  83  Resp:  18  Temp:  36.7 C  SpO2: 95% 98%    Last Pain:  Vitals:   02/20/22 1110  TempSrc:   PainSc: 6                  Martha Clan

## 2022-02-20 NOTE — Progress Notes (Signed)
Post Partum Day 1 Subjective: Doing well, no complaints.  Tolerating regular diet, pain with PO meds, voiding and ambulating without difficulty.  No CP SOB Fever,Chills, N/V or leg pain; denies nipple or breast pain, no HA change of vision, RUQ/epigastric pain  Objective: BP 111/76 (BP Location: Left Arm)   Pulse 83   Temp 98.1 F (36.7 C) (Oral)   Resp 18   Ht '5\' 1"'$  (1.549 m)   Wt 93.9 kg   LMP 05/17/2021   SpO2 98%   Breastfeeding Yes   BMI 39.11 kg/m    Physical Exam:  General: NAD Breasts: soft/nontender CV: RRR Pulm: nl effort, CTABL Abdomen: soft, NT, BS x 4 Incision: Honeycomb  Dsg CDI/Steristrips intact/no erythema or drainage Lochia: small Uterine Fundus: fundus firm and 1 fb below umbilicus DVT Evaluation: no cords, ttp LEs   Recent Labs    02/18/22 1456 02/19/22 0647  HGB 12.5 11.8*  HCT 36.0 34.3*  WBC 6.9 13.6*  PLT 163 143*    Assessment/Plan: 41 y.o. [id number] postpartum day # 1  - Continue routine PP care - Lactation consult prn - BTL performed.  - Acute blood loss anemia - hemodynamically stable and asymptomatic; start po ferrous sulfate BID with stool softeners  - Immunization status: all Imms up to date    Disposition: Does not desire Dc home today.     Francetta Found, CNM 02/20/2022  11:33 AM

## 2022-02-21 ENCOUNTER — Encounter: Payer: Self-pay | Admitting: Obstetrics and Gynecology

## 2022-02-21 MED ORDER — FERROUS SULFATE 325 (65 FE) MG PO TABS: 325.0000 mg | ORAL_TABLET | Freq: Two times a day (BID) | ORAL | 3 refills | Status: AC

## 2022-02-21 MED ORDER — SIMETHICONE 80 MG PO CHEW: 80.0000 mg | CHEWABLE_TABLET | Freq: Three times a day (TID) | ORAL | 0 refills | Status: AC

## 2022-02-21 MED ORDER — OXYCODONE HCL 5 MG PO TABS
5.0000 mg | ORAL_TABLET | ORAL | 0 refills | Status: AC | PRN
Start: 2022-02-21 — End: ?

## 2022-02-21 NOTE — Progress Notes (Signed)
Patient discharged home with infant. FOB and daughter present at discharge. Discharge instructions and prescriptions given and reviewed with patient. Patient verbalized understanding.   Follow-up appointments scheduled for 2-weeks and 6-weeks with Dr. Leafy Ro at [facility].   Pt had a headache just prior to leaving and nurse explained importance of calling if it gets worse or signs/symptoms if something more serious. Pt verbalized understanding and stated she thinks it is due to lack of sleep and lack of caffeine.   Will be escorted out by volunteers.

## 2022-02-22 LAB — SURGICAL PATHOLOGY

## 2022-02-24 ENCOUNTER — Emergency Department
Admission: EM | Admit: 2022-02-24 | Discharge: 2022-02-24 | Disposition: A | Discharge status: Home / Self Care | Payer: Medicaid Other | Attending: Emergency Medicine | Admitting: Emergency Medicine

## 2022-02-24 ENCOUNTER — Emergency Department: Payer: Medicaid Other

## 2022-02-24 ENCOUNTER — Other Ambulatory Visit: Payer: Self-pay

## 2022-02-24 DIAGNOSIS — R0789 Other chest pain: Secondary | ICD-10-CM | POA: Diagnosis not present

## 2022-02-24 DIAGNOSIS — R1012 Left upper quadrant pain: Secondary | ICD-10-CM

## 2022-02-24 LAB — COMPREHENSIVE METABOLIC PANEL
ALT: 34 U/L (ref 0–44)
AST: 56 U/L — ABNORMAL HIGH (ref 15–41)
Albumin: 2.9 g/dL — ABNORMAL LOW (ref 3.5–5.0)
Alkaline Phosphatase: 109 U/L (ref 38–126)
Anion gap: 8 (ref 5–15)
BUN: 10 mg/dL (ref 6–20)
CO2: 24 mmol/L (ref 22–32)
Calcium: 9 mg/dL (ref 8.9–10.3)
Chloride: 111 mmol/L (ref 98–111)
Creatinine, Ser: 0.84 mg/dL (ref 0.44–1.00)
GFR, Estimated: >60 mL/min (ref >60)
Glucose, Bld: 91 mg/dL (ref 70–99)
Potassium: 3.8 mmol/L (ref 3.5–5.1)
Sodium: 143 mmol/L (ref 135–145)
Total Bilirubin: 1 mg/dL (ref 0.3–1.2)
Total Protein: 6.9 g/dL (ref 6.5–8.1)

## 2022-02-24 LAB — CBC
HCT: 32.4 % — ABNORMAL LOW (ref 36.0–46.0)
Hemoglobin: 10.9 g/dL — ABNORMAL LOW (ref 12.0–15.0)
MCH: 31.4 pg (ref 26.0–34.0)
MCHC: 33.6 g/dL (ref 30.0–36.0)
MCV: 93.4 fL (ref 80.0–100.0)
Platelets: 241 10*3/uL (ref 150–400)
RBC: 3.47 MIL/uL — ABNORMAL LOW (ref 3.87–5.11)
RDW: 13.3 % (ref 11.5–15.5)
WBC: 6.8 10*3/uL (ref 4.0–10.5)
nRBC: 0 % (ref 0.0–0.2)

## 2022-02-24 LAB — LIPASE, BLOOD: Lipase: 21 U/L (ref 11–51)

## 2022-02-24 LAB — URINALYSIS, ROUTINE W REFLEX MICROSCOPIC
Bacteria, UA: NONE SEEN
Bilirubin Urine: NEGATIVE
Glucose, UA: NEGATIVE mg/dL
Ketones, ur: NEGATIVE mg/dL
Leukocytes,Ua: NEGATIVE
Nitrite: NEGATIVE
Protein, ur: NEGATIVE mg/dL
Specific Gravity, Urine: 1.005 (ref 1.005–1.030)
pH: 7 (ref 5.0–8.0)

## 2022-02-24 LAB — POC URINE PREG, ED: Preg Test, Ur: NEGATIVE

## 2022-02-24 MED ORDER — KETOROLAC TROMETHAMINE 30 MG/ML IJ SOLN
30.0000 mg | Freq: Once | INTRAMUSCULAR | Status: AC
Start: 2022-02-24 — End: 2022-02-24
  Administered 2022-02-24: 30 mg via INTRAVENOUS
  Filled 2022-02-24: qty 1

## 2022-02-24 MED ORDER — IOHEXOL 300 MG/ML  SOLN
100.0000 mL | Freq: Once | INTRAMUSCULAR | Status: AC | PRN
  Administered 2022-02-24: 100 mL via INTRAVENOUS

## 2022-02-24 MED ORDER — SODIUM CHLORIDE 0.9 % IV BOLUS
1000.0000 mL | Freq: Once | INTRAVENOUS | Status: AC
Start: 2022-02-24 — End: 2022-02-24
  Administered 2022-02-24: 1000 mL via INTRAVENOUS

## 2022-02-24 NOTE — Discharge Instructions (Signed)
Please use ibuprofen (Motrin) up to 800 mg every 8 hours, naproxen (Naprosyn) up to 500 mg every 12 hours, and/or acetaminophen (Tylenol) up to 4 g/day for any continued pain 

## 2022-02-24 NOTE — ED Notes (Signed)
70 yof with a c/c of upper left sided abdominal pain for the past two days. The pt advised she recently had a c-section on the 9th of this month. The pt also advised she had taken pain meds at approx. 6 am this morning. The pt is warm, pink, and dry. The pt is also alert and oriented x4.

## 2022-02-24 NOTE — ED Provider Notes (Signed)
[facility] Provider Note   Event Date/Time   First MD Initiated Contact with Patient 02/24/22 0913     (approximate) History  Abdominal Pain  HPI Briana James is a 41 y.o. female with a recent past medical history of C-section with live birth on 02/18/2022 who presents for left upper quadrant/left lower anterior chest wall pain and tenderness to palpation that has been present over the last 2 days.  Patient states that she believed it was from her post pregnancy girdle that she was told to wear however when she took the scrotal off she stated the pain initially improved however worsened over the course of the day to the point where she now complains of 10/10, aching, nonradiating pain overlying this left upper abdomen/lower chest wall area that is worsened with palpation or taking a deep breath and has no relieving factors despite patient trying oxycodone and hydromorphone p.o. ROS: Patient currently denies any vision changes, tinnitus, difficulty speaking, facial droop, sore throat, shortness of breath, abdominal pain, nausea/vomiting/diarrhea, dysuria, or weakness/numbness/paresthesias in any extremity   Physical Exam  Triage Vital Signs: ED Triage Vitals [02/24/22 0902]  Enc Vitals Group     BP (!) 125/113     Pulse Rate 74     Resp 20     Temp 97.8 F (36.6 C)     Temp Source Oral     SpO2 94 %     Weight 202 lb (91.6 kg)     Height '5\' 1"'$  (1.549 m)     Head Circumference      Peak Flow      Pain Score 10     Pain Loc      Pain Edu?      Excl. in [place]?    Most recent vital signs: Vitals:   02/24/22 1000 02/24/22 1143  BP: 130/76 126/82  Pulse: 64 62  Resp: 16 17  Temp:  98.1 F (36.7 C)  SpO2: 96% 100%   General: Awake, oriented x4. CV:  Good peripheral perfusion.  Resp:  Normal effort.  Abd:  No distention.  Other:  Middle-aged obese Hispanic female laying in bed in mild distress secondary to pain.  Tenderness to palpation over the end of the  left 10th and 11th rib ED Results / Procedures / Treatments  Labs (all labs ordered are listed, but only abnormal results are displayed) Labs Reviewed  COMPREHENSIVE METABOLIC PANEL - Abnormal; Notable for the following components:      Result Value   Albumin 2.9 (*)    AST 56 (*)    All other components within normal limits  CBC - Abnormal; Notable for the following components:   RBC 3.47 (*)    Hemoglobin 10.9 (*)    HCT 32.4 (*)    All other components within normal limits  URINALYSIS, ROUTINE W REFLEX MICROSCOPIC - Abnormal; Notable for the following components:   Color, Urine YELLOW (*)    APPearance HAZY (*)    Hgb urine dipstick LARGE (*)    All other components within normal limits  LIPASE, BLOOD  POC URINE PREG, ED   EKG ED ECG REPORT I, Naaman Plummer, the attending physician, personally viewed and interpreted this ECG. Date: 02/24/2022 EKG Time: 0907 Rate: 75 Rhythm: normal sinus rhythm QRS Axis: normal Intervals: normal ST/T Wave abnormalities: normal Narrative Interpretation: no evidence of acute ischemia RADIOLOGY ED MD interpretation: CT of the abdomen and pelvis with IV contrast shows multiple areas of  of peripheral low-density in bilateral kidneys suggesting pyelonephritis or possibly infarction -Agree with radiology assessment Official radiology report(s): CT Abdomen Pelvis W Contrast  Result Date: 02/24/2022 CLINICAL DATA:  Left upper quadrant abdominal pain status post Caesarean section. EXAM: CT ABDOMEN AND PELVIS WITH CONTRAST TECHNIQUE: Multidetector CT imaging of the abdomen and pelvis was performed using the standard protocol following bolus administration of intravenous contrast. RADIATION DOSE REDUCTION: This exam was performed according to the departmental dose-optimization program which includes automated exposure control, adjustment of the mA and/or kV according to patient size and/or use of iterative reconstruction technique. CONTRAST:  17m  OMNIPAQUE IOHEXOL 300 MG/ML  SOLN COMPARISON:  None Available. FINDINGS: Lower chest: No acute abnormality. Hepatobiliary: No focal liver abnormality is seen. Status post cholecystectomy. No biliary dilatation. Pancreas: Unremarkable. No pancreatic ductal dilatation or surrounding inflammatory changes. Spleen: Normal in size without focal abnormality. Adrenals/Urinary Tract: Adrenal glands appear normal. Multiple areas of peripheral low density are noted throughout both renal cortices suggesting lobar nephronia or possibly infarcts. No hydronephrosis or renal obstruction is noted. Urinary bladder is unremarkable. Stomach/Bowel: Stomach is within normal limits. Appendix appears normal. No evidence of bowel wall thickening, distention, or inflammatory changes. Vascular/Lymphatic: No significant vascular findings are present. No enlarged abdominal or pelvic lymph nodes. Reproductive: Enlarged uterus is noted consistent with recent postpartum status. No definite adnexal abnormality is noted. Other: Stranding of peritoneal fat is noted in the pelvis consistent with recent Caesarean section. No definite hernia or ascites is noted. Musculoskeletal: No acute or significant osseous findings. IMPRESSION: Multiple areas of peripheral low density are noted in both kidneys suggesting pyelonephritis or possibly infarctions. Correlation with laboratory and clinical data is recommended. No hydronephrosis or renal obstruction is noted. Enlarged uterus is noted consistent with recent postpartum status and Caesarean section. Electronically Signed   By: JMarijo ConceptionM.D.   On: 02/24/2022 10:33   PROCEDURES: Critical Care performed: No .1-3 Lead EKG Interpretation  Performed by: BNaaman Plummer MD Authorized by: BNaaman Plummer MD     Interpretation: normal     ECG rate:  63   ECG rate assessment: normal     Rhythm: sinus rhythm     Ectopy: none     Conduction: normal    MEDICATIONS ORDERED IN ED: Medications   ketorolac (TORADOL) 30 MG/ML injection 30 mg (30 mg Intravenous Given 02/24/22 0952)  sodium chloride 0.9 % bolus 1,000 mL (0 mLs Intravenous Stopped 02/24/22 1133)  iohexol (OMNIPAQUE) 300 MG/ML solution 100 mL (100 mLs Intravenous Contrast Given 02/24/22 1007)   IMPRESSION / MDM / ASSESSMENT AND PLAN / ED COURSE  I reviewed the triage vital signs and the nursing notes.                             The patient is on the cardiac monitor to evaluate for evidence of arrhythmia and/or significant heart rate changes. Patient's presentation is most consistent with acute presentation with potential threat to life or bodily function. Patients symptoms not typical for emergent causes of abdominal pain such as, but not limited to, appendicitis, abdominal aortic aneurysm, surgical biliary disease, pancreatitis, SBO, mesenteric ischemia, serious intra-abdominal bacterial illness. Presentation also not typical of gynecologic emergencies such as TOA, Ovarian Torsion, PID. Not Ectopic. Doubt atypical ACS.  Pt tolerating PO. Disposition: Patient will be discharged with strict return precautions and follow up with primary MD within 12-24 hours for further evaluation. Patient understands that  this still may have an early presentation of an emergent medical condition such as appendicitis that will require a recheck. Clinical Course as of 02/24/22 1537  Thu Feb 24, 2022  1050 CT Abdomen Pelvis W Contrast [EB]    Clinical Course User Index [EB] Naaman Plummer, MD   FINAL CLINICAL IMPRESSION(S) / ED DIAGNOSES   Final diagnoses:  Abdominal pain, left upper quadrant   Rx / DC Orders   ED Discharge Orders     None      Note:  This document was prepared using Dragon voice recognition software and may include unintentional dictation errors.   Naaman Plummer, MD 02/24/22 1538

## 2022-02-24 NOTE — ED Triage Notes (Signed)
Pt states that she has been having pain in her LUQ x2 days but it got worse this morning- pt states it is hard to do anything d/t the pain- pt denies n/v/d- pt has had some dizziness- pt states she was seen the 9th for the same thing

## 2022-02-24 NOTE — ED Notes (Signed)
Pt A&O, IV removed, pt given discharge instructions, pt ambulating with steady gait. 

## 2022-05-09 IMAGING — US US OB < 14 WEEKS - US OB TV
2 series · 14 of 28 positions shown · non-contrast
Comparison: None for this gestation

CLINICAL DATA: First trimester pregnancy, dating, positive
pregnancy test, LMP 05/13/2021

EXAM:
OBSTETRIC <14 WK US AND TRANSVAGINAL OB US
TECHNIQUE: Both transabdominal and transvaginal ultrasound examinations were
performed for complete evaluation of the gestation as well as the
maternal uterus, adnexal regions, and pelvic cul-de-sac.
Transvaginal technique was performed to assess early pregnancy.

[Series 1: us ob less than 14 weeks with ob transvaginal · 13 of 103 slices shown (1 of 2)]
[im 4/103]
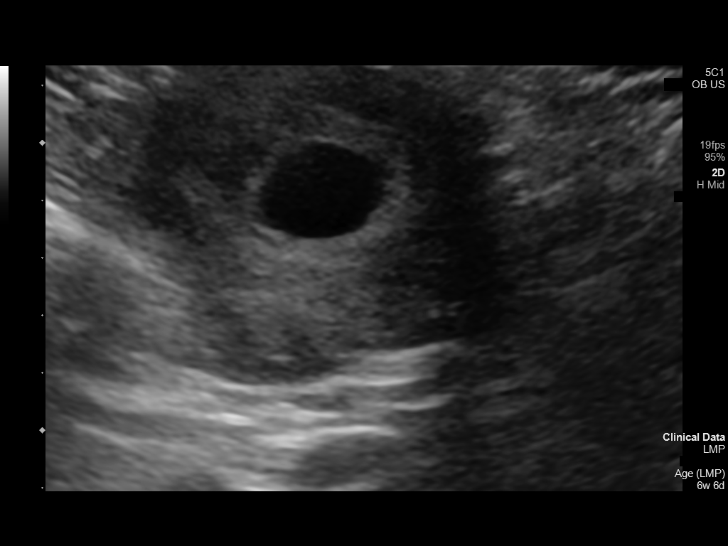
[im 12/103]
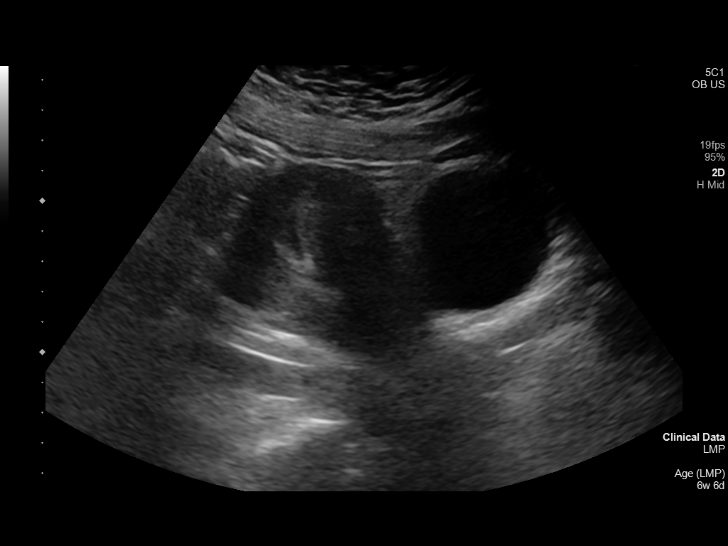
[im 20/103]
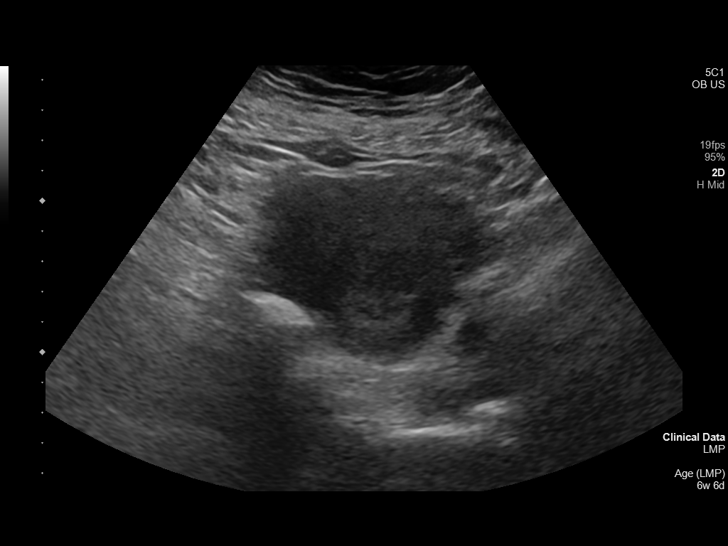
[im 28/103]
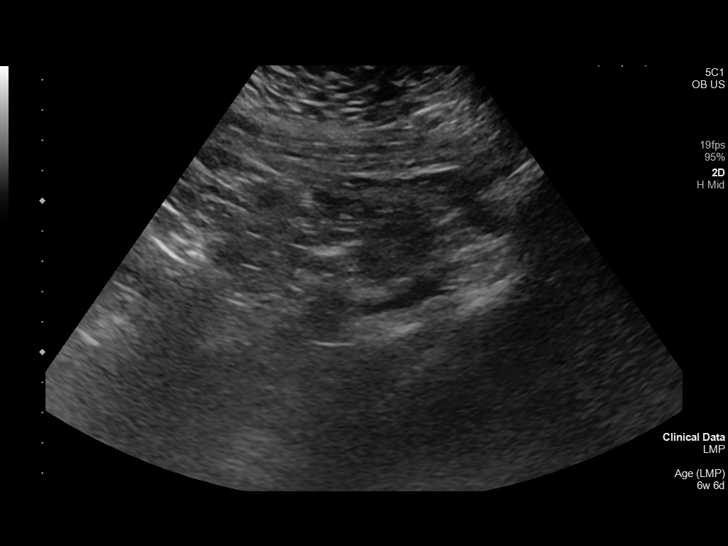
[im 36/103]
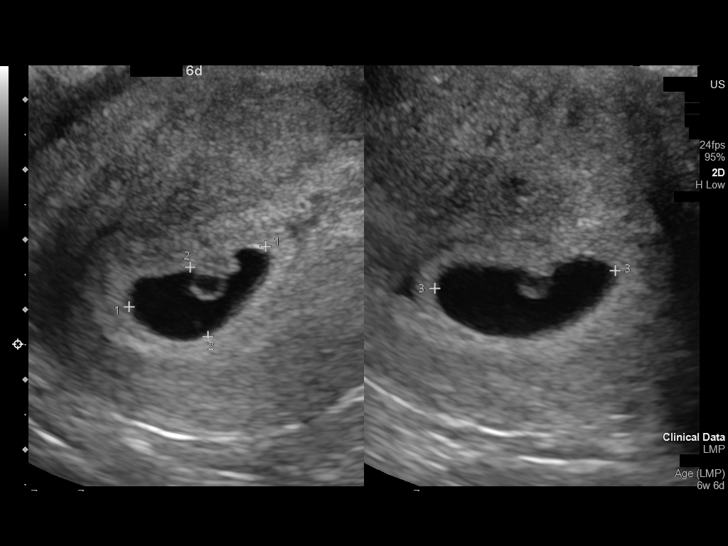
[im 44/103]
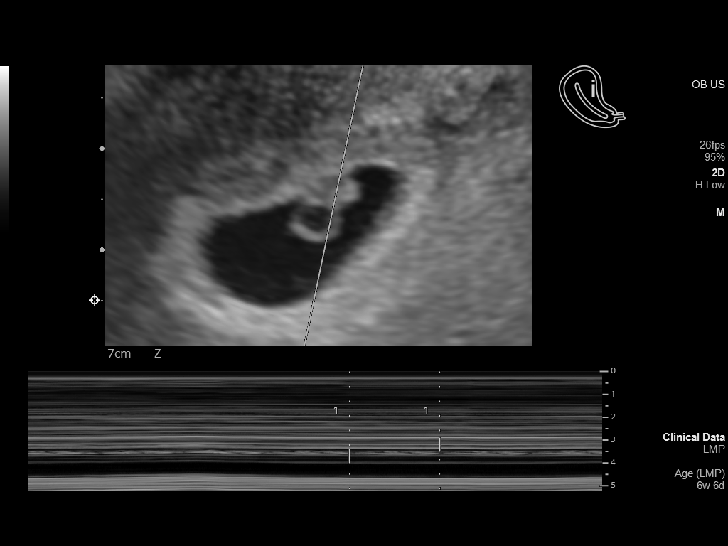
[im 52/103]
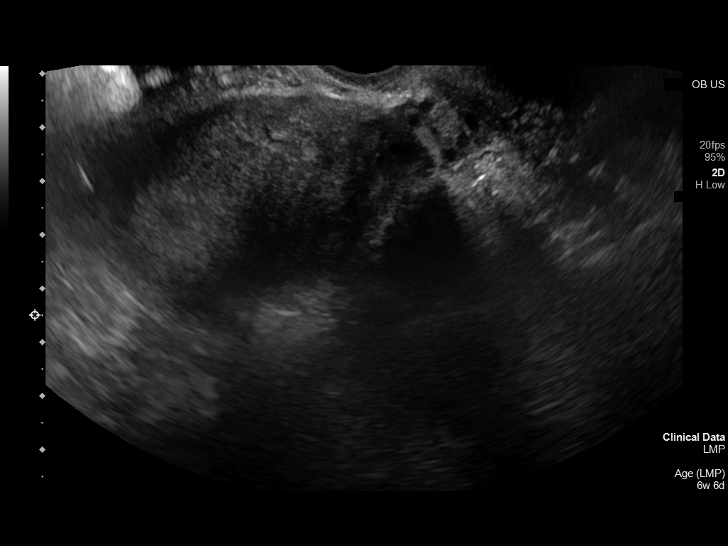
[im 59/103]
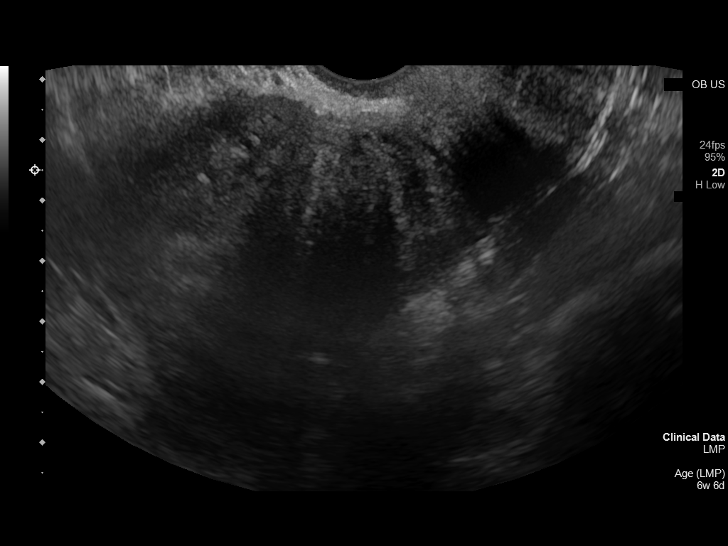
[im 67/103]
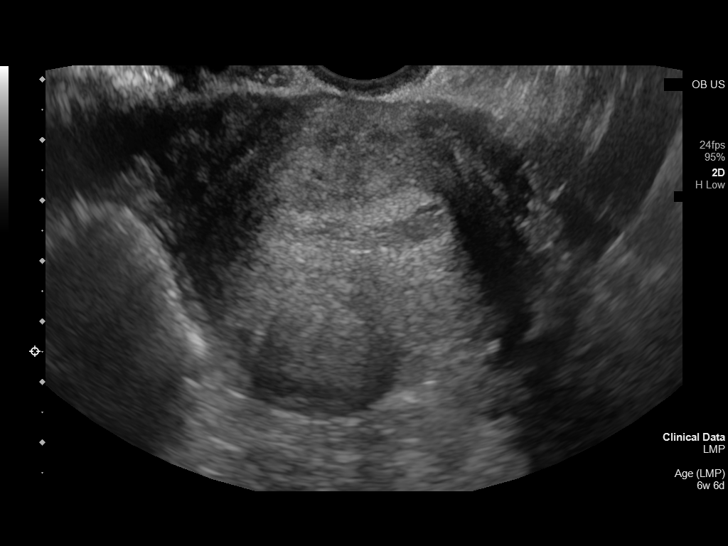
[im 75/103]
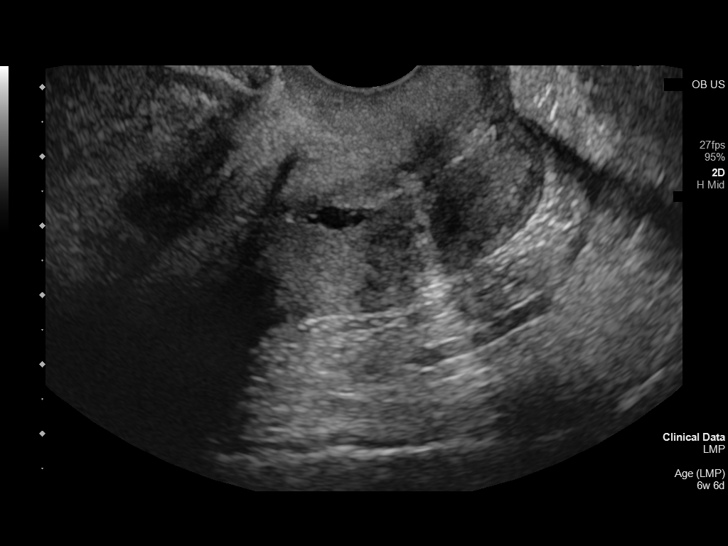
[im 83/103]
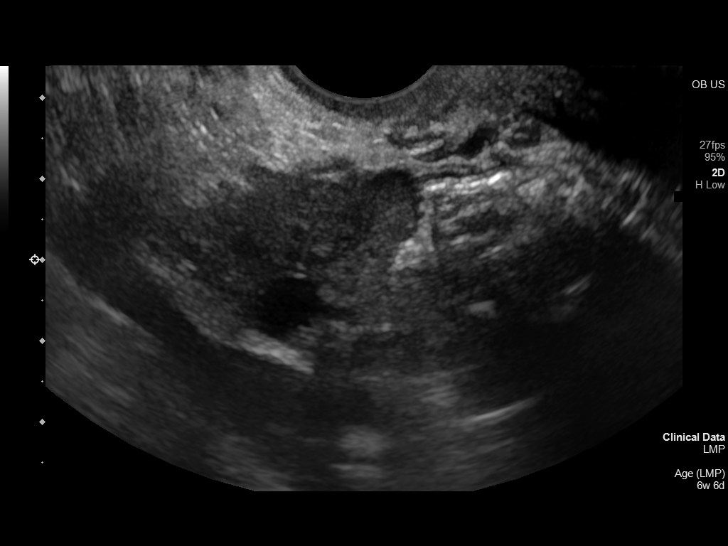
[im 91/103]
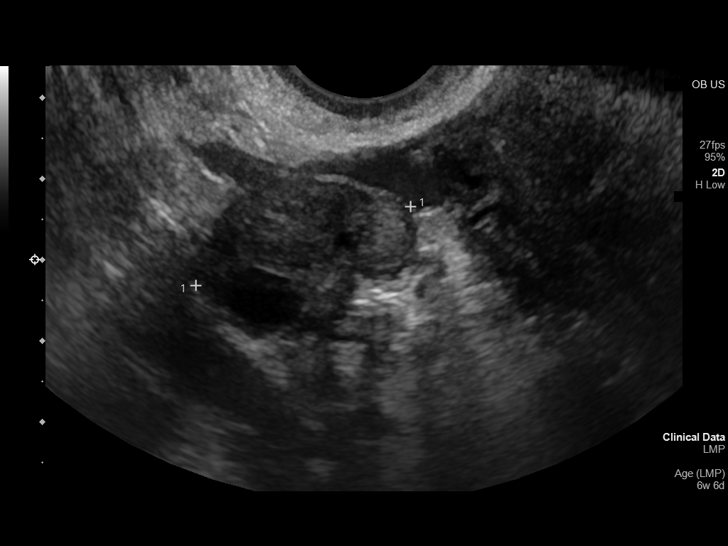
[im 99/103]
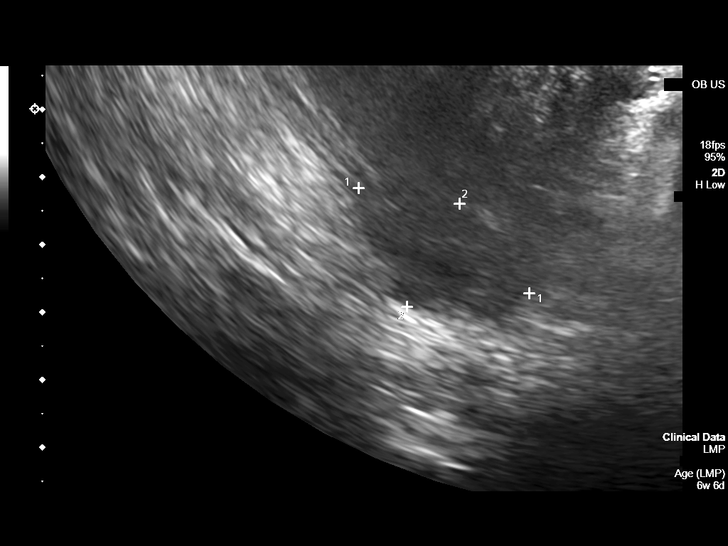

[Series 1001: us ob less than 14 weeks with ob transvaginal · 1 of 4 slices shown (2 of 2)]
[im 1/4]
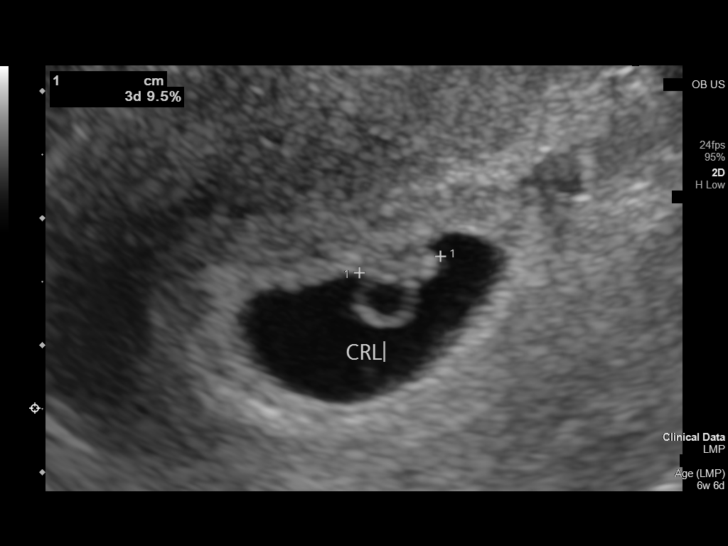

[14 of 28 positions shown; findings below may reference images not displayed]

FINDINGS: Intrauterine gestational sac: Present, single

Yolk sac:  Present

Embryo:  Present

Cardiac Activity: Present

Heart Rate: 113 bpm

CRL:  5.5 mm   6 w   2 d                  US EDC: 02/21/2022

Subchorionic hemorrhage:  None visualized.

Maternal uterus/adnexae:

Subserosal leiomyoma posterior uterus 2.2 x 1.7 x 2.5 cm.

Ovaries unremarkable.

No free pelvic fluid or adnexal masses.
IMPRESSION: Single live intrauterine gestation at 6 weeks 2 days EGA as above.

Subserosal leiomyoma posterior uterus 2.5 cm greatest diameter.

## 2023-03-27 ENCOUNTER — Other Ambulatory Visit: Payer: Self-pay

## 2023-03-27 ENCOUNTER — Emergency Department
Admission: EM | Admit: 2023-03-27 | Discharge: 2023-03-27 | Disposition: A | Discharge status: Home / Self Care | Payer: Medicaid Other | Attending: Emergency Medicine | Admitting: Emergency Medicine

## 2023-03-27 DIAGNOSIS — R519 Headache, unspecified: Secondary | ICD-10-CM | POA: Diagnosis present

## 2023-03-27 DIAGNOSIS — S060X0A Concussion without loss of consciousness, initial encounter: Secondary | ICD-10-CM | POA: Insufficient documentation

## 2023-03-27 DIAGNOSIS — M7918 Myalgia, other site: Secondary | ICD-10-CM

## 2023-03-27 DIAGNOSIS — M791 Myalgia, unspecified site: Secondary | ICD-10-CM | POA: Diagnosis not present

## 2023-03-27 DIAGNOSIS — Y92410 Unspecified street and highway as the place of occurrence of the external cause: Secondary | ICD-10-CM | POA: Diagnosis not present

## 2023-03-27 MED ORDER — ONDANSETRON 4 MG PO TBDP
4.0000 mg | ORAL_TABLET | Freq: Three times a day (TID) | ORAL | 0 refills | Status: AC | PRN
Start: 2023-03-27 — End: ?

## 2023-03-27 MED ORDER — ONDANSETRON 4 MG PO TBDP
4.0000 mg | ORAL_TABLET | Freq: Once | ORAL | Status: AC
  Administered 2023-03-27: 4 mg via ORAL
  Filled 2023-03-27: qty 1

## 2023-03-27 MED ORDER — CYCLOBENZAPRINE HCL 5 MG PO TABS: 5.0000 mg | ORAL_TABLET | Freq: Three times a day (TID) | ORAL | 0 refills | Status: AC | PRN

## 2023-03-27 MED ORDER — CYCLOBENZAPRINE HCL 10 MG PO TABS
10.0000 mg | ORAL_TABLET | Freq: Once | ORAL | Status: AC
  Administered 2023-03-27: 10 mg via ORAL
  Filled 2023-03-27: qty 1
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
## 2023-10-23 ENCOUNTER — Telehealth: Payer: Self-pay | Admitting: Plastic Surgery

## 2023-10-23 NOTE — Telephone Encounter (Signed)
 Pt called and asked if we had received her referral because she was unsure where her referral went. Pt was in the ED 03-27-2023 but the oncall sched does not go that for back. Pt stated she saw her PCP and her face is still swollen and was recommended to see plastic surgery. Is this something we can treat. She stated that her face is still swollen since her ED visit.     As I was writing this message, I called her PCP office and they stated they referred her to [facility] plastic surgery not [facility]. If she can't by chance get into [facility] is this something we can see?

## 2023-10-23 NOTE — Telephone Encounter (Signed)
 Pt called us  back after speaking with [facility], they do not accept her insurance. Pt asked if we can see her here at [facility] for a swollen face? Please advise

## 2024-02-21 ENCOUNTER — Other Ambulatory Visit: Payer: Self-pay | Admitting: Primary Care

## 2024-02-21 DIAGNOSIS — Z1231 Encounter for screening mammogram for malignant neoplasm of breast: Secondary | ICD-10-CM

## 2024-04-05 ENCOUNTER — Ambulatory Visit
Admission: RE | Admit: 2024-04-05 | Discharge: 2024-04-05 | Disposition: A | Discharge status: Home / Self Care | Source: Ambulatory Visit | Attending: Primary Care | Admitting: Primary Care

## 2024-04-05 DIAGNOSIS — Z1231 Encounter for screening mammogram for malignant neoplasm of breast: Secondary | ICD-10-CM | POA: Insufficient documentation
